# Patient Record
Sex: Female | Born: 1955 | Race: White | Hispanic: No | Marital: Married | Smoking: Never smoker
Health system: Southern US, Community
[De-identification: ages and names within clinical notes are randomized; demographics above are authoritative.]

## PROBLEM LIST (undated history)

## (undated) DIAGNOSIS — F32A Depression, unspecified: Secondary | ICD-10-CM

## (undated) DIAGNOSIS — H532 Diplopia: Secondary | ICD-10-CM

## (undated) DIAGNOSIS — F329 Major depressive disorder, single episode, unspecified: Secondary | ICD-10-CM

## (undated) DIAGNOSIS — M069 Rheumatoid arthritis, unspecified: Secondary | ICD-10-CM

## (undated) DIAGNOSIS — R5381 Other malaise: Secondary | ICD-10-CM

## (undated) DIAGNOSIS — N3010 Interstitial cystitis (chronic) without hematuria: Secondary | ICD-10-CM

## (undated) DIAGNOSIS — IMO0001 Reserved for inherently not codable concepts without codable children: Secondary | ICD-10-CM

## (undated) DIAGNOSIS — R413 Other amnesia: Secondary | ICD-10-CM

## (undated) DIAGNOSIS — I38 Endocarditis, valve unspecified: Secondary | ICD-10-CM

## (undated) DIAGNOSIS — R5383 Other fatigue: Secondary | ICD-10-CM

## (undated) DIAGNOSIS — M4712 Other spondylosis with myelopathy, cervical region: Secondary | ICD-10-CM

## (undated) DIAGNOSIS — F419 Anxiety disorder, unspecified: Secondary | ICD-10-CM

## (undated) DIAGNOSIS — F4310 Post-traumatic stress disorder, unspecified: Secondary | ICD-10-CM

## (undated) DIAGNOSIS — G43009 Migraine without aura, not intractable, without status migrainosus: Secondary | ICD-10-CM

## (undated) HISTORY — DX: Post-traumatic stress disorder, unspecified: F43.10

## (undated) HISTORY — DX: Endocarditis, valve unspecified: I38

## (undated) HISTORY — DX: Anxiety disorder, unspecified: F41.9

## (undated) HISTORY — DX: Reserved for inherently not codable concepts without codable children: IMO0001

## (undated) HISTORY — DX: Migraine without aura, not intractable, without status migrainosus: G43.009

## (undated) HISTORY — DX: Depression, unspecified: F32.A

## (undated) HISTORY — DX: Other spondylosis with myelopathy, cervical region: M47.12

## (undated) HISTORY — DX: Other malaise: R53.83

## (undated) HISTORY — DX: Interstitial cystitis (chronic) without hematuria: N30.10

## (undated) HISTORY — DX: Major depressive disorder, single episode, unspecified: F32.9

## (undated) HISTORY — DX: Other malaise: R53.81

## (undated) HISTORY — DX: Other amnesia: R41.3

## (undated) HISTORY — PX: ABDOMINAL HYSTERECTOMY: SHX81

## (undated) HISTORY — DX: Rheumatoid arthritis, unspecified: M06.9

## (undated) HISTORY — DX: Diplopia: H53.2

---

## 1991-11-24 HISTORY — PX: LUMBAR LAMINECTOMY: SHX95

## 2005-10-26 ENCOUNTER — Ambulatory Visit (HOSPITAL_COMMUNITY): Admission: RE | Admit: 2005-10-26 | Discharge: 2005-10-27 | Payer: Self-pay | Admitting: Neurosurgery

## 2006-07-05 ENCOUNTER — Encounter: Admission: RE | Admit: 2006-07-05 | Discharge: 2006-07-05 | Payer: Self-pay | Admitting: Neurosurgery

## 2006-08-02 ENCOUNTER — Ambulatory Visit (HOSPITAL_COMMUNITY): Admission: RE | Admit: 2006-08-02 | Discharge: 2006-08-02 | Payer: Self-pay | Admitting: Neurology

## 2009-09-24 ENCOUNTER — Encounter: Admission: RE | Admit: 2009-09-24 | Discharge: 2009-09-24 | Payer: Self-pay | Admitting: Neurology

## 2011-04-10 NOTE — Procedures (Signed)
BRAINSTEM AUDITORY EVOKED RESPONSE TEST   HISTORY:  This is a 55 year old patient who is complaining of weakness in  the right leg.  He is being evaluated for possible demyelinating disease.   Brainstem auditory evoked response test was performed bilaterally using 85  decibel rarefaction clicks in the ipsilateral ear.  The absolute latency for  waveforms I, III, and V are within normal limits bilaterally.  The interpeak  latencies for waveforms I/III, III/V and I/V are also within normal limits  bilaterally.  Amplitudes for waveforms I and V are within normal limits  bilaterally.   IMPRESSION:  Brainstem auditory evoked response test above is within normal  limits bilaterally.  No evidence of peripheral or central conduction delay  is seen on either side by today's evaluation.      Marlan Palau, M.D.  Electronically Signed     ZOX:WRUE  D:  08/02/2006 16:51:02  T:  08/03/2006 11:20:10  Job #:  [id number]

## 2011-04-10 NOTE — Op Note (Signed)
[name], [name]            ACCOUNT NO.:  [id number]   MEDICAL RECORD NO.:  [id number]          PATIENT TYPE:  AMB   LOCATION:  SDS                          FACILITY:  [facility]   PHYSICIAN:  Donalee Citrin, M.D.        DATE OF BIRTH:  Jun 30, 1956   DATE OF PROCEDURE:  10/26/2005  DATE OF DISCHARGE:                                 OPERATIVE REPORT   PREOPERATIVE DIAGNOSIS:  Lumbar spinal stenosis L3-L4 and L4-L5 with  bilateral lumbar radiculopathy with weakness in her legs.   POSTOPERATIVE DIAGNOSIS:  Lumbar spinal stenosis L3-L4 and L4-L5 with  bilateral lumbar radiculopathy with weakness in her legs.   PROCEDURE:  Decompressive lumbar laminectomy bilaterally at L3-L4 with  microdiscectomy from the right at L3-L4 and decompressive laminectomy on the  right at L4-L5 with microdiscectomy of the L4-L5 disc space.   SURGEON:  Donalee Citrin, M.D.   ASSISTANT:  Kathaleen Maser. Pool, M.D.   ANESTHESIA:  General endotracheal anesthesia.   HISTORY OF PRESENT ILLNESS:  The patient is a very pleasant 55 year old  female who has had long-standing back with leg pain but also weakness in her  leg that has gotten progressively worse whenever she would ambulate just  short distances.  This has been refractory to all forms of conservative  treatment.  Preoperative images showed severe lumbar spinal stenosis at L4-  L5 causing severe compression of the thecal sac and both L4 nerve roots as  well as L4-L5 severe facet arthropathy and lateral recess stenosis.  After  failure of all forms of conservative treatment and progressive worsening of  her symptoms, the patient is recommended decompressive laminectomies at L3-  L4 bilaterally and L4 on the right.  I discussed the risks and benefits of  the surgery which she understood and agreed to proceed forth.   DESCRIPTION OF PROCEDURE:  The patient was brought to the operating room,  given general anesthesia, positioned prone on the Wilson frame.  The back  was  prepped and draped in the usual sterile fashion.  After a preop x-ray  localized the L4 disc space, a midline incision was made.  Bovie  electrocautery was used to dissect through the subcutaneous tissue and  subperiosteal dissection was carried out at the lamina of what was believed  to be L3-L4.  The initial x-ray marked it to be the L2-L3 disc space and  attention was turned one space below this and the spinous process of L3 was  removed.  The facets were noted to be markedly hypertrophic and causing hour  glass compression of the thecal sac with severe facet arthropathy and  ligamentous hypertrophy.  This was all dissected off the dura with the 4  Penfield and the 3 and 4 mm Kerrison punch were used to underbite the facet  complex and lateral gutters to decompress the thecal sac.  There was noted  to be marked stenosis right at the superior aspect of the L4 lamina due to  the facet arthropathy.  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  Then, after the gutters  were underbitten, the 3 and 4 roots were decompressed and the interspace was  inspected and this was noted to be a free fragment and significant bulging  on the proximal under surface of the L4 root on the right so this was  incised with an 11 blade scalpel, pituitary rongeurs, and radically cleaned  out the interspace under microscopic illumination.  Then, attention was  turned to L4-L5.  Decompressive laminectomy was begun, again, severe facet  arthropathy was causing hour glass compression of the thecal sac at this  level and thi was all dissected free with a 4 Penfield and underbitten.  At  the end of the decompression, the 4 root was noted to be widely  decompressed.  The interspace was inspected and a disc was palpable  underneath the facet complex causing compression of both proximal fiber and  4  roots.  This was incised with an 11 blade scalpel and pituitary rongeurs  were used to adequately clean out the disc space in stepwise fashion.  The  wound was copiously irrigated and meticulous hemostasis was maintained.  Gelfoam was overlaid on top of the dura.  The muscle and fascia were  reapproximated with 0 interrupted Vicryl and the skin was closed with  running 4-0 subcuticular.  Benzoin and Steri-Strips were applied.  The  patient went to the recovery room in stable condition.  At the end of the  case, the needle, sponge, and instrument counts were correct.           ______________________________  Donalee Citrin, M.D.     GC/MEDQ  D:  10/26/2005  T:  10/26/2005  Job:  [id number]

## 2013-02-28 ENCOUNTER — Other Ambulatory Visit: Payer: Self-pay

## 2013-02-28 NOTE — Telephone Encounter (Signed)
Patient called clinic requesting appt with Dr Frances Furbish and refill on Lyrica.  Former [facility] patient.

## 2013-03-01 MED ORDER — PREGABALIN 50 MG PO CAPS: ORAL_CAPSULE | ORAL | Status: DC

## 2013-05-18 ENCOUNTER — Encounter: Payer: Self-pay | Admitting: Neurology

## 2013-05-18 ENCOUNTER — Ambulatory Visit (INDEPENDENT_AMBULATORY_CARE_PROVIDER_SITE_OTHER): Payer: BC Managed Care – PPO | Admitting: Neurology

## 2013-05-18 VITALS — BP 141/92 | HR 94 | Temp 97.2°F | Ht 65.5 in | Wt 188.0 lb

## 2013-05-18 DIAGNOSIS — M549 Dorsalgia, unspecified: Secondary | ICD-10-CM

## 2013-05-18 DIAGNOSIS — Z87898 Personal history of other specified conditions: Secondary | ICD-10-CM

## 2013-05-18 NOTE — Patient Instructions (Addendum)
I think overall you are doing fairly well but I do want to suggest a few things today:  Remember to drink plenty of fluid, eat healthy meals and do not skip any meals. Try to eat protein with a every meal and eat a healthy snack such as fruit or nuts in between meals. Try to keep a regular sleep-wake schedule and try to exercise daily, particularly in the form of walking, 20-30 minutes a day, if you can.   As far as your medications are concerned, I would like to suggest no changes, I will renew your Rx for your cream today.   As far as diagnostic testing: no new test needed.   I would like to see you back in 6 months, Alverda Skeans, our nurse will call you for an appt.
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
## 2013-06-19 ENCOUNTER — Other Ambulatory Visit: Payer: Self-pay

## 2013-06-19 MED ORDER — TOPIRAMATE 100 MG PO TABS: 100.0000 mg | ORAL_TABLET | Freq: Every day | ORAL | Status: DC

## 2013-06-19 MED ORDER — METHOCARBAMOL 500 MG PO TABS: 750.0000 mg | ORAL_TABLET | Freq: Three times a day (TID) | ORAL | Status: DC

## 2013-06-19 NOTE — Telephone Encounter (Signed)
Former [facility] patient, was assigned to Dr Frances Furbish, but has had a transfer of care to Dr Anne Hahn.

## 2013-08-25 ENCOUNTER — Other Ambulatory Visit: Payer: Self-pay

## 2013-08-25 MED ORDER — PREGABALIN 50 MG PO CAPS: ORAL_CAPSULE | ORAL | Status: DC

## 2013-08-25 NOTE — Telephone Encounter (Signed)
Rx signed and faxed.

## 2013-09-28 ENCOUNTER — Other Ambulatory Visit: Payer: Self-pay

## 2013-12-11 ENCOUNTER — Other Ambulatory Visit: Payer: Self-pay | Admitting: Neurology

## 2013-12-27 ENCOUNTER — Ambulatory Visit (INDEPENDENT_AMBULATORY_CARE_PROVIDER_SITE_OTHER): Payer: BC Managed Care – PPO | Admitting: Neurology

## 2013-12-27 ENCOUNTER — Encounter: Payer: Self-pay | Admitting: Neurology

## 2013-12-27 VITALS — BP 148/86 | HR 83 | Wt 184.0 lb

## 2013-12-27 DIAGNOSIS — IMO0001 Reserved for inherently not codable concepts without codable children: Secondary | ICD-10-CM

## 2013-12-27 DIAGNOSIS — R413 Other amnesia: Secondary | ICD-10-CM

## 2013-12-27 HISTORY — DX: Other amnesia: R41.3

## 2013-12-27 MED ORDER — ACETAMINOPHEN-CODEINE #3 300-30 MG PO TABS: 1.0000 | ORAL_TABLET | Freq: Four times a day (QID) | ORAL | Status: DC | PRN

## 2013-12-27 MED ORDER — METHOCARBAMOL 750 MG PO TABS: 750.0000 mg | ORAL_TABLET | Freq: Three times a day (TID) | ORAL | Status: DC

## 2013-12-27 NOTE — Progress Notes (Signed)
Reason for visit: Fibromyalgia  [name] is an 58 y.o. female  History of present illness:  [name] is a 58 year old right-handed white female with a history of fibromyalgia. The patient also reports anxiety, depression, and a posttraumatic stress disorder. The patient has had some problems with cognitive processing that she has had for several years. The patient has been followed through this office since 2007, seen previously by Dr. Sandria Manly. The patient reports ongoing problems with fatigue, and she has diffuse pain in the back, neck, arms, and legs. The patient denies any true weakness. The patient indicates that she sleeps well at night. The patient claims that her memory problems and concentration problems began in August of 2011 after a nervous breakdown. The patient has been seen and followed actively through a psychiatrist. The patient is on some Lyrica, which also indicates that a topical cream that was given to her by Dr. Sandria Manly has been helpful for the pain. The patient has run out of this cream. The patient indicates that regular massage therapy also helps her, but the insurance company does not pay for this therapy. The patient is sent to this office for an evaluation.  Past Medical History  Diagnosis Date  . Myalgia and myositis, unspecified   . Chronic interstitial cystitis   . Memory loss   . Cervical spondylosis with myelopathy   . Other malaise and fatigue   . Rheumatoid arthritis(714.0)   . Migraine without aura, without mention of intractable migraine without mention of status migrainosus   . Myalgia and myositis, unspecified   . Diplopia   . Memory disorder   . PTSD (post-traumatic stress disorder)   . Anxiety and depression   . Memory deficit 12/27/2013    Past Surgical History  Procedure Laterality Date  . Lumbar laminectomy Left 1993  . Abdominal hysterectomy    . Cesarean section      Family History  Problem Relation Age of Onset  . Heart failure  Father   . Hypertension Mother     Social history:  reports that she has never smoked. She does not have any smokeless tobacco history on file. She reports that she does not drink alcohol or use illicit drugs.    Allergies  Allergen Reactions  . Asa [Aspirin] Shortness Of Breath    Medications:  Current Outpatient Prescriptions on File Prior to Visit  Medication Sig Dispense Refill  . b complex vitamins capsule Take 1 capsule by mouth daily.      . busPIRone (BUSPAR) 30 MG tablet Take 30 mg by mouth 2 (two) times daily.       . cholecalciferol (VITAMIN D) 1000 UNITS tablet Take 1,000 Units by mouth daily.      . DULoxetine (CYMBALTA) 30 MG capsule Take 30 mg by mouth daily.       Marland Kitchen eletriptan (RELPAX) 40 MG tablet Take 40 mg by mouth as needed for migraine. One tablet by mouth at onset of headache. May repeat in 2 hours if headache persists or recurs.      Marland Kitchen estradiol (ESTRACE) 0.1 MG/GM vaginal cream Place 2 g vaginally. M, W, F      . fenofibrate (TRICOR) 48 MG tablet Take 48 mg by mouth daily.       . folic acid (FOLVITE) 1 MG tablet Take 1 mg by mouth daily.       Marland Kitchen GLUCOSAMINE CHONDROITIN COMPLX PO Take by mouth.      . hydrochlorothiazide (HYDRODIURIL)  25 MG tablet Take 25 mg by mouth daily.       Marland Kitchen HYDROcodone-acetaminophen (NORCO/VICODIN) 5-325 MG per tablet Take 1 tablet by mouth every 6 (six) hours as needed for pain.      . hyoscyamine (ANASPAZ) 0.125 MG TBDP Place 0.125 mg under the tongue every 4 (four) hours as needed.      Marland Kitchen LORazepam (ATIVAN) 0.5 MG tablet Take 0.5 mg by mouth 2 (two) times daily.       . Magnesium 100 MG TABS Take 100 mg by mouth. 2 tabs in a.m; 2 tabs in pm      . Methotrexate Sodium, PF, 50 MG/2ML SOLN Inject 25 mg as directed once a week.       . Multiple Vitamin (MULTIVITAMIN) capsule Take 1 capsule by mouth daily.      Marland Kitchen NASONEX 50 MCG/ACT nasal spray Place 2 sprays into the nose daily.       . Omega-3 Fatty Acids (FISH OIL) 1000 MG CAPS Take  1,000 mg by mouth daily.      Marland Kitchen omeprazole (PRILOSEC) 40 MG capsule Take 40 mg by mouth 2 (two) times daily.       . pregabalin (LYRICA) 50 MG capsule Two capsules po in am, one cap at noon and two caps at bedtime  450 capsule  1  . Probiotic Product (PROBIOTIC DAILY) CAPS Take by mouth.      . sertraline (ZOLOFT) 100 MG tablet Take 200 mg by mouth daily.       . Tocilizumab (ACTEMRA IV) Inject into the vein.      Marland Kitchen topiramate (TOPAMAX) 100 MG tablet TAKE 1 BY MOUTH DAILY  90 tablet  0   No current facility-administered medications on file prior to visit.    ROS:  Out of a complete 14 system review of symptoms, the patient complains only of the following symptoms, and all other reviewed systems are negative.  Fatigue Neck pain, neck stiffness, ear pain, reading in the ears Light sensitivity, eye pain Excessive thirst Environmental allergies, frequent infections Sleep talking, acting out dreams Joint pain, joint swelling, back pain, achy muscles, muscle cramps, walking difficulties, coordination problems Memory loss, headache, speech difficulties Anxiety and depression  Blood pressure 148/86, pulse 83, weight 184 lb (83.462 kg).  Physical Exam  General: The patient is alert and cooperative at the time of the examination. The patient has moderate obesity.  Neuromuscular: The patient has excellent range of motion of the cervical and lumbosacral spine.  Skin: No significant peripheral edema is noted.   Neurologic Exam  Mental status: The patient is oriented x 3. Mini-Mental status examination done today shows a total score of 29/30.  Cranial nerves: Facial symmetry is present. Speech is normal, no aphasia or dysarthria is noted. Extraocular movements are full. Visual fields are full.  Motor: The patient has good strength in all 4 extremities.  Sensory examination: Soft touch sensation is decreased on the right leg as compared to left, symmetric in arms and on the  face.  Coordination: The patient has good finger-nose-finger and heel-to-shin bilaterally.  Gait and station: The patient has a normal gait. Tandem gait is normal. Romberg is negative. No drift is seen.  Reflexes: Deep tendon reflexes are symmetric.   Assessment/Plan:  1. Fibromyalgia  2. Anxiety and depression  3. Posttraumatic stress disorder  4. Reported memory disturbance  The patient may have some difficulty with concentration associated with the fibromyalgia. We will need to follow the memory issues  over time. The patient will be given a prescription for a compounded cream for the diffuse neuromuscular pain. The patient was given a prescription for Tylenol #3, and for Robaxin. The patient will followup through this office in 6 months.  Marlan Palau MD 12/27/2013 8:21 PM  [facility] [address] [place], [place] [address]  Phone [phone] Fax [phone]

## 2013-12-27 NOTE — Patient Instructions (Signed)

## 2014-02-09 ENCOUNTER — Other Ambulatory Visit: Payer: Self-pay

## 2014-02-09 MED ORDER — PREGABALIN 50 MG PO CAPS: ORAL_CAPSULE | ORAL | Status: DC

## 2014-02-09 NOTE — Telephone Encounter (Signed)
Rx signed and faxed.

## 2014-03-13 ENCOUNTER — Telehealth: Payer: Self-pay | Admitting: Nurse Practitioner

## 2014-03-13 NOTE — Telephone Encounter (Signed)
Pharmacist with information for you Shanda Bumps, CPHT. FYI

## 2014-03-13 NOTE — Telephone Encounter (Signed)
Pharmacist calling to give [place] a heads up that [employer] will be faxing [employer] over another form to fill out for the recently prescribed compound cream.

## 2014-03-13 NOTE — Telephone Encounter (Signed)
Forms have been completed and faxed back to [facility]

## 2014-04-02 ENCOUNTER — Other Ambulatory Visit: Payer: Self-pay | Admitting: Neurology

## 2014-06-27 ENCOUNTER — Telehealth: Payer: Self-pay | Admitting: *Deleted

## 2014-06-27 NOTE — Telephone Encounter (Signed)
Patient returning my call appointment was r/s to 2 pm on 06/28/14 with NP MM.

## 2014-06-27 NOTE — Telephone Encounter (Signed)
Calling patient to see if she could come in at an earlier time, no answer, left voice message to return the call.

## 2014-06-28 ENCOUNTER — Encounter: Payer: Self-pay | Admitting: Adult Health

## 2014-06-28 ENCOUNTER — Ambulatory Visit (INDEPENDENT_AMBULATORY_CARE_PROVIDER_SITE_OTHER): Payer: BC Managed Care – PPO | Admitting: Adult Health

## 2014-06-28 VITALS — BP 131/83 | HR 72 | Wt 191.0 lb

## 2014-06-28 DIAGNOSIS — R413 Other amnesia: Secondary | ICD-10-CM

## 2014-06-28 DIAGNOSIS — IMO0001 Reserved for inherently not codable concepts without codable children: Secondary | ICD-10-CM

## 2014-06-28 NOTE — Patient Instructions (Signed)
Fibromyalgia Fibromyalgia is a disorder that is often misunderstood. It is associated with muscular pains and tenderness that comes and goes. It is often associated with fatigue and sleep disturbances. Though it tends to be long-lasting, fibromyalgia is not life-threatening. CAUSES  The exact cause of fibromyalgia is unknown. People with certain gene types are predisposed to developing fibromyalgia and other conditions. Certain factors can play a role as triggers, such as:  Spine disorders.  Arthritis.  Severe injury (trauma) and other physical stressors.  Emotional stressors. SYMPTOMS   The main symptom is pain and stiffness in the muscles and joints, which can vary over time.  Sleep and fatigue problems. Other related symptoms may include:  Bowel and bladder problems.  Headaches.  Visual problems.  Problems with odors and noises.  Depression or mood changes.  Painful periods (dysmenorrhea).  Dryness of the skin or eyes. DIAGNOSIS  There are no specific tests for diagnosing fibromyalgia. Patients can be diagnosed accurately from the specific symptoms they have. The diagnosis is made by determining that nothing else is causing the problems. TREATMENT  There is no cure. Management includes medicines and an active, healthy lifestyle. The goal is to enhance physical fitness, decrease pain, and improve sleep. HOME CARE INSTRUCTIONS   Only take over-the-counter or prescription medicines as directed by your caregiver. Sleeping pills, tranquilizers, and pain medicines may make your problems worse.  Low-impact aerobic exercise is very important and advised for treatment. At first, it may seem to make pain worse. Gradually increasing your tolerance will overcome this feeling.  Learning relaxation techniques and how to control stress will help you. Biofeedback, visual imagery, hypnosis, muscle relaxation, yoga, and meditation are all options.  Anti-inflammatory medicines and  physical therapy may provide short-term help.  Acupuncture or massage treatments may help.  Take muscle relaxant medicines as suggested by your caregiver.  Avoid stressful situations.  Plan a healthy lifestyle. This includes your diet, sleep, rest, exercise, and friends.  Find and practice a hobby you enjoy.  Join a fibromyalgia support group for interaction, ideas, and sharing advice. This may be helpful. SEEK MEDICAL CARE IF:  You are not having good results or improvement from your treatment. FOR MORE INFORMATION  National Fibromyalgia Association: www.fmaware.org Arthritis Foundation: www.arthritis.org Document Released: 11/09/2005 Document Revised: 02/01/2012 Document Reviewed: 02/19/2010 Exit[employer] Patient Information 2015 [employer]. This information is not intended to replace advice given to you by your health care provider. Make sure you discuss any questions you have with your health care provider.  

## 2014-06-28 NOTE — Progress Notes (Addendum)
PATIENT: [name] DOB: August 01, 1956  REASON FOR VISIT: follow up HISTORY FROM: patient  HISTORY OF PRESENT ILLNESS: [name] is a 58 year old white female with a history of fibromyalgia. She returns today for follow-up. She is currently taking lyrica and is tolerating it well. She also uses a compound cream that works well for her. At the last visit she was prescribed Robaxin and Tylenol #3. Patient has a history of anxiety, depression and PTSD. She is followed by a psychiatrist. Patient states things are going better than it has been. She is getting three massages a week and that has been very helpful. Patient has back and neck pain that is relieved by the massages. Patient states that overall she is pain free right now with the combination of medications and massages. Patient continues to have issues with processing information. Patient states that she has different routines for when she gets overwhelmed with information. Patient states that she is now sleeping very well. Overall patient feels like she doing well and that pain is well controlled. No new medical issues since last seen.    HISTORY 12/27/13 (CW):[name] is a 58 year old right-handed white female with a history of fibromyalgia. The patient also reports anxiety, depression, and a posttraumatic stress disorder. The patient has had some problems with cognitive processing that she has had for several years. The patient has been followed through this office since 2007, seen previously by Dr. Sandria Manly. The patient reports ongoing problems with fatigue, and she has diffuse pain in the back, neck, arms, and legs. The patient denies any true weakness. The patient indicates that she sleeps well at night. The patient claims that her memory problems and concentration problems began in August of 2011 after a nervous breakdown. The patient has been seen and followed actively through a psychiatrist. The patient is on some Lyrica, which also  indicates that a topical cream that was given to her by Dr. Sandria Manly has been helpful for the pain. The patient has run out of this cream. The patient indicates that regular massage therapy also helps her, but the insurance company does not pay for this therapy. The patient is sent to this office for an evaluation.  REVIEW OF SYSTEMS: Full 14 system review of systems performed and notable only for:  Constitutional: N/A  Eyes: N/A Ear/Nose/Throat: Facial swelling at cheekbones Skin: N/A  Cardiovascular: N/A  Respiratory: N/A  Gastrointestinal: Constipation Genitourinary: N/A Hematology/Lymphatic: N/A  Endocrine: N/A Musculoskeletal: Joint pain, joint swelling, back pain, E. muscles, muscle cramps, neck stiffness, neck pain Allergy/Immunology: N/A  Neurological: Memory loss, headache Psychiatric: Depression, nervous/anxious Sleep: Sleep talking   ALLERGIES: Allergies  Allergen Reactions  . Asa [Aspirin] Shortness Of Breath  . Ibuprofen Other (See Comments)    Wheezing and throat swells    HOME MEDICATIONS: Outpatient Prescriptions Prior to Visit  Medication Sig Dispense Refill  . acetaminophen-codeine (TYLENOL #3) 300-30 MG per tablet Take 1 tablet by mouth every 6 (six) hours as needed for moderate pain.  180 tablet  0  . b complex vitamins capsule Take 1 capsule by mouth daily.      . busPIRone (BUSPAR) 30 MG tablet Take 30 mg by mouth 2 (two) times daily.       . cholecalciferol (VITAMIN D) 1000 UNITS tablet Take 1,000 Units by mouth daily.      . DULoxetine (CYMBALTA) 30 MG capsule Take 30 mg by mouth daily.       Marland Kitchen eletriptan (RELPAX) 40 MG  tablet Take 40 mg by mouth as needed for migraine. One tablet by mouth at onset of headache. May repeat in 2 hours if headache persists or recurs.      Marland Kitchen. estradiol (ESTRACE) 0.1 MG/GM vaginal cream Place 2 g vaginally. M, W, F      . fenofibrate (TRICOR) 48 MG tablet Take 48 mg by mouth daily.       . Ferrous Fumarate (IRON) 18 MG TBCR Take  18 mg by mouth daily.      . fluconazole (DIFLUCAN) 100 MG tablet Take 100 mg by mouth daily.      . folic acid (FOLVITE) 1 MG tablet Take 1 mg by mouth daily.       Marland Kitchen. GLUCOSAMINE CHONDROITIN COMPLX PO Take by mouth.      . hydrochlorothiazide (HYDRODIURIL) 25 MG tablet Take 25 mg by mouth daily.       Marland Kitchen. HYDROcodone-acetaminophen (NORCO/VICODIN) 5-325 MG per tablet Take 1 tablet by mouth every 6 (six) hours as needed for pain.      . hyoscyamine (ANASPAZ) 0.125 MG TBDP Place 0.125 mg under the tongue every 4 (four) hours as needed.      Marland Kitchen. LORazepam (ATIVAN) 0.5 MG tablet Take 0.5 mg by mouth 2 (two) times daily.       . Magnesium 100 MG TABS Take 100 mg by mouth. 2 tabs in a.m; 2 tabs in pm      . methocarbamol (ROBAXIN) 750 MG tablet Take 1 tablet (750 mg total) by mouth 3 (three) times daily.  270 tablet  1  . Methotrexate Sodium, PF, 50 MG/2ML SOLN Inject 25 mg as directed once a week.       . Misc Natural Products (GLUCOSAMINE CHOND COMPLEX/MSM) TABS Take 1 tablet by mouth daily as needed.      . Multiple Vitamin (MULTIVITAMIN) capsule Take 1 capsule by mouth daily.      Marland Kitchen. NASONEX 50 MCG/ACT nasal spray Place 2 sprays into the nose daily.       . Omega-3 Fatty Acids (FISH OIL) 1000 MG CAPS Take 1,000 mg by mouth daily.      Marland Kitchen. omeprazole (PRILOSEC) 40 MG capsule Take 40 mg by mouth 2 (two) times daily.       . pregabalin (LYRICA) 50 MG capsule Two capsules po in am, one cap at noon and two caps at bedtime  450 capsule  1  . Probiotic Product (PROBIOTIC DAILY) CAPS Take by mouth.      . sertraline (ZOLOFT) 100 MG tablet Take 200 mg by mouth daily.       . Tocilizumab (ACTEMRA IV) Inject into the vein.      Marland Kitchen. topiramate (TOPAMAX) 100 MG tablet TAKE 1 BY MOUTH DAILY  90 tablet  1   No facility-administered medications prior to visit.    PAST MEDICAL HISTORY: Past Medical History  Diagnosis Date  . Myalgia and myositis, unspecified   . Chronic interstitial cystitis   . Memory loss   .  Cervical spondylosis with myelopathy   . Other malaise and fatigue   . Rheumatoid arthritis(714.0)   . Migraine without aura, without mention of intractable migraine without mention of status migrainosus   . Myalgia and myositis, unspecified   . Diplopia   . Memory disorder   . PTSD (post-traumatic stress disorder)   . Anxiety and depression   . Memory deficit 12/27/2013    PAST SURGICAL HISTORY: Past Surgical History  Procedure Laterality Date  .  Lumbar laminectomy Left 1993  . Abdominal hysterectomy    . Cesarean section      FAMILY HISTORY: Family History  Problem Relation Age of Onset  . Heart failure Father   . Hypertension Mother     SOCIAL HISTORY: History   Social History  . Marital Status: Married    Spouse Name: N/A    Number of Children: N/A  . Years of Education: N/A   Occupational History  . Not on file.   Social History Main Topics  . Smoking status: Never Smoker   . Smokeless tobacco: Not on file  . Alcohol Use: No  . Drug Use: No  . Sexual Activity: Not on file   Other Topics Concern  . Not on file   Social History Narrative  . No narrative on file      PHYSICAL EXAM  There were no vitals filed for this visit. There is no weight on file to calculate BMI.  Generalized: Well developed, in no acute distress   Neurological examination  Mentation: Alert oriented to time, place, history taking. Follows all commands speech and language fluent. MMSE 29/30 Cranial nerve II-XII: Pupils were equal round reactive to light. Extraocular movements were full, visual field were full on confrontational test. Facial sensation and strength were normal. Uvula tongue midline. Head turning and shoulder shrug  were normal and symmetric. Motor: The motor testing reveals 5 over 5 strength of all 4 extremities. Good symmetric motor tone is noted throughout.  Sensory: Sensory testing is intact to soft touch on all 4 extremities. No evidence of extinction is noted.    Coordination: Cerebellar testing reveals good finger-nose-finger and heel-to-shin bilaterally.  Gait and station: Gait is normal. Tandem gait is normal. Romberg is negative. No drift is seen.  Reflexes: Deep tendon reflexes are symmetric and normal bilaterally.    DIAGNOSTIC DATA (LABS, IMAGING, TESTING) - I reviewed patient records, labs, notes, testing and imaging myself where available.  ASSESSMENT AND PLAN 58 y.o. year old female  has a past medical history of Myalgia and myositis, unspecified; Chronic interstitial cystitis; Memory loss; Cervical spondylosis with myelopathy; Other malaise and fatigue; Rheumatoid arthritis(714.0); Migraine without aura, without mention of intractable migraine without mention of status migrainosus; Myalgia and myositis, unspecified; Diplopia; Memory disorder; PTSD (post-traumatic stress disorder); Anxiety and depression; and Memory deficit (12/27/2013). here with:  1. Fibromyalgia  2. Memory deficit 3. Anxiety and depression    Overall patient is doing very well. She feels that this is the best that she has felt since her symptoms began. She continues to take Lyrica and is tolerating it well. She continues to use the compound cream as needed. She also uses Robaxin and Tylenol # 3. She does not need any refills today. Patient currently gets 3 massages a week and feels that this has been very beneficial for her neck and back pain. I am pleased that the patient has achieved relief from her pain. Patient should follow up in 6 months or sooner if needed.  Butch Penny, MSN, NP-C 06/28/2014, 1:52 PM [facility] [address] [place], [place] [address] [phone]  Note: This document was prepared with digital dictation and possible smart phrase technology. Any transcriptional errors that result from this process are unintentional.

## 2014-08-09 ENCOUNTER — Other Ambulatory Visit: Payer: Self-pay

## 2014-08-09 MED ORDER — PREGABALIN 50 MG PO CAPS: ORAL_CAPSULE | ORAL | Status: DC

## 2014-09-17 ENCOUNTER — Other Ambulatory Visit: Payer: Self-pay | Admitting: Neurology

## 2014-12-31 ENCOUNTER — Ambulatory Visit (INDEPENDENT_AMBULATORY_CARE_PROVIDER_SITE_OTHER): Payer: BLUE CROSS/BLUE SHIELD | Admitting: Adult Health

## 2014-12-31 ENCOUNTER — Encounter: Payer: Self-pay | Admitting: Adult Health

## 2014-12-31 VITALS — BP 137/83 | HR 90

## 2014-12-31 DIAGNOSIS — M791 Myalgia: Secondary | ICD-10-CM

## 2014-12-31 DIAGNOSIS — IMO0001 Reserved for inherently not codable concepts without codable children: Secondary | ICD-10-CM

## 2014-12-31 DIAGNOSIS — G43019 Migraine without aura, intractable, without status migrainosus: Secondary | ICD-10-CM

## 2014-12-31 DIAGNOSIS — R413 Other amnesia: Secondary | ICD-10-CM

## 2014-12-31 DIAGNOSIS — M609 Myositis, unspecified: Secondary | ICD-10-CM

## 2014-12-31 MED ORDER — TOPIRAMATE 50 MG PO TABS: ORAL_TABLET | ORAL | Status: DC

## 2014-12-31 MED ORDER — ELETRIPTAN HYDROBROMIDE 40 MG PO TABS: 40.0000 mg | ORAL_TABLET | ORAL | Status: DC | PRN

## 2014-12-31 NOTE — Patient Instructions (Signed)
I will increase Topamax to 1 tablet 50 mg in the morning and 2 tablets 100 mg in the evening to help with nerve related pain as well as the migraines. Relpax refilled.  Continue Cymbalta, lyrica, compound cream and tylenol #3 for associated discomfort. If your symptoms worsen or do not improve please let us know.

## 2014-12-31 NOTE — Progress Notes (Signed)
I have read the note, and I agree with the clinical assessment and plan.  WILLIS,CHARLES KEITH   

## 2014-12-31 NOTE — Progress Notes (Signed)
PATIENT: [name] DOB: [birth year]  REASON FOR VISIT: follow up- fibromyalgia,memory, migraines HISTORY FROM: patient  HISTORY OF PRESENT ILLNESS: [name] is a 59 year old white female with a history of fibromyalgia. She returns today for follow-up. She is currently taking lyrica and is tolerating it well. She also uses a compound cream that works well for her. She also takes Robaxin and Tylenol #3. She reports that she was doing well until 3 months ago she began to have right occiptal nerve pain. She states that the pain will radiate from the right occpital nerve to the shoulder. She continues to see a massage therapist and they have been working in the occiptal area to help relieve the pain. Patient also has a history of migraines. She uses topamax and Relpax. She reports that she has approximately 2-4 migraines a month but she states this can vary. Her headaches are normally located in thetemporal area bilaterally  and sometimes in the occpital area. + nausea denies vomiting. + photophobia and phonophobia. No new medical issues since last seen.   HISTORY 06/28/14: [name] is a 59 year old white female with a history of fibromyalgia. She returns today for follow-up. She is currently taking lyrica and is tolerating it well. She also uses a compound cream that works well for her. At the last visit she was prescribed Robaxin and Tylenol #3. Patient has a history of anxiety, depression and PTSD. She is followed by a psychiatrist. Patient states things are going better than it has been. She is getting three massages a week and that has been very helpful. Patient has back and neck pain that is relieved by the massages. Patient states that overall she is pain free right now with the combination of medications and massages. Patient continues to have issues with processing information. Patient states that she has different routines for when she gets overwhelmed with information. Patient states  that she is now sleeping very well. Overall patient feels like she doing well and that pain is well controlled. No new medical issues since last seen.    REVIEW OF SYSTEMS: Out of a complete 14 system review of symptoms, the patient complains only of the following symptoms, and all other reviewed systems are negative.  Eye discharge, blurred vision, ear pain, abdominal pain, constipation, nausea, excessive thirst, acting out dreams, urgency, delayed urination, chest wall pain, rosacea, memory loss, headache, decreased concentration, depression, nervous/anxious, hallucinations  ALLERGIES: Allergies  Allergen Reactions  . Asa [Aspirin] Shortness Of Breath  . Ibuprofen Other (See Comments)    Wheezing and throat swells    HOME MEDICATIONS: Outpatient Prescriptions Prior to Visit  Medication Sig Dispense Refill  . acetaminophen-codeine (TYLENOL #3) 300-30 MG per tablet Take 1 tablet by mouth every 6 (six) hours as needed for moderate pain. 180 tablet 0  . azelastine (OPTIVAR) 0.05 % ophthalmic solution Place 0.05 drops into both eyes daily.    Marland Kitchen b complex vitamins capsule Take 1 capsule by mouth daily.    . busPIRone (BUSPAR) 30 MG tablet Take 30 mg by mouth 2 (two) times daily.     . cholecalciferol (VITAMIN D) 1000 UNITS tablet Take 1,000 Units by mouth daily.    . DULoxetine (CYMBALTA) 30 MG capsule Take 30 mg by mouth daily.     Marland Kitchen eletriptan (RELPAX) 40 MG tablet Take 40 mg by mouth as needed for migraine. One tablet by mouth at onset of headache. May repeat in 2 hours if headache persists or recurs.    Marland Kitchen  estradiol (ESTRACE) 0.1 MG/GM vaginal cream Place 2 g vaginally. M, W, F    . fenofibrate (TRICOR) 48 MG tablet Take 48 mg by mouth daily.     . Ferrous Fumarate (IRON) 18 MG TBCR Take 18 mg by mouth daily.    . fluconazole (DIFLUCAN) 100 MG tablet Take 100 mg by mouth daily.    . folic acid (FOLVITE) 1 MG tablet Take 1 mg by mouth daily.     . Glucosamine-Chondroitin-MSM 500-250-250 MG  CAPS Take 1 tablet by mouth daily.    . hydrochlorothiazide (HYDRODIURIL) 25 MG tablet Take 25 mg by mouth daily.     Marland Kitchen HYDROcodone-acetaminophen (NORCO/VICODIN) 5-325 MG per tablet Take 1 tablet by mouth every 6 (six) hours as needed for pain.    . hydrocortisone valerate cream (WESTCORT) 0.2 % Apply 0.2 g topically daily.    . hyoscyamine (ANASPAZ) 0.125 MG TBDP Place 0.125 mg under the tongue every 4 (four) hours as needed.    Marland Kitchen LORazepam (ATIVAN) 0.5 MG tablet Take 0.5 mg by mouth 2 (two) times daily.     . Magnesium 100 MG TABS Take 100 mg by mouth. 2 tabs in a.m; 2 tabs in pm    . methocarbamol (ROBAXIN) 750 MG tablet TAKE 1 BY MOUTH 3 TIMES DAILY 270 tablet 1  . Methotrexate Sodium, PF, 50 MG/2ML SOLN Inject 25 mg as directed once a week.     . Misc Natural Products (GLUCOSAMINE CHOND COMPLEX/MSM) TABS Take 1 tablet by mouth daily as needed.    . Multiple Vitamin (MULTIVITAMIN) capsule Take 1 capsule by mouth daily.    . Multiple Vitamins-Minerals (MULTIVITAMIN PO) Take 1 tablet by mouth daily.    Marland Kitchen NASONEX 50 MCG/ACT nasal spray Place 2 sprays into the nose daily.     . Omega-3 Fatty Acids (FISH OIL) 1000 MG CAPS Take 1,000 mg by mouth daily.    Marland Kitchen omeprazole (PRILOSEC) 40 MG capsule Take 40 mg by mouth 2 (two) times daily.     . pregabalin (LYRICA) 50 MG capsule Two capsules po in am, one cap at noon and two caps at bedtime 450 capsule 1  . Probiotic Product (PROBIOTIC DAILY) CAPS Take by mouth.    . rosuvastatin (CRESTOR) 10 MG tablet Take 10 mg by mouth daily.    . sertraline (ZOLOFT) 100 MG tablet Take 200 mg by mouth daily.     Marland Kitchen topiramate (TOPAMAX) 100 MG tablet TAKE 1 BY MOUTH DAILY 90 tablet 1   No facility-administered medications prior to visit.    PAST MEDICAL HISTORY: Past Medical History  Diagnosis Date  . Myalgia and myositis, unspecified   . Chronic interstitial cystitis   . Memory loss   . Cervical spondylosis with myelopathy   . Other malaise and fatigue   .  Rheumatoid arthritis(714.0)   . Migraine without aura, without mention of intractable migraine without mention of status migrainosus   . Myalgia and myositis, unspecified   . Diplopia   . Memory disorder   . PTSD (post-traumatic stress disorder)   . Anxiety and depression   . Memory deficit 12/27/2013    PAST SURGICAL HISTORY: Past Surgical History  Procedure Laterality Date  . Lumbar laminectomy Left 1993  . Abdominal hysterectomy    . Cesarean section      FAMILY HISTORY: Family History  Problem Relation Age of Onset  . Heart failure Father   . Hypertension Mother     PHYSICAL EXAM  Filed Vitals:  12/31/14 1336  BP: 137/83  Pulse: 90   There is no weight on file to calculate BMI.  Generalized: Well developed, in no acute distress   Neurological examination  Mentation: Alert oriented to time, place, history taking. Follows all commands speech and language fluent. MMSE 30/30 Cranial nerve II-XII: Pupils were equal round reactive to light. Extraocular movements were full, visual field were full on confrontational test. Facial sensation and strength were normal. Uvula tongue midline. Head turning and shoulder shrug  were normal and symmetric. Motor: The motor testing reveals 5 over 5 strength of all 4 extremities. Good symmetric motor tone is noted throughout.  Sensory: Sensory testing is intact to soft touch on all 4 extremities. No evidence of extinction is noted.  Coordination: Cerebellar testing reveals good finger-nose-finger and heel-to-shin bilaterally.  Gait and station: Gait is normal. Tandem gait is normal. Romberg is negative. No drift is seen.  Reflexes: Deep tendon reflexes are symmetric and normal bilaterally.   DIAGNOSTIC DATA (LABS, IMAGING, TESTING) - I reviewed patient records, labs, notes, testing and imaging myself where available.     ASSESSMENT AND PLAN 59 y.o. year old female  has a past medical history of Myalgia and myositis, unspecified;  Chronic interstitial cystitis; Memory loss; Cervical spondylosis with myelopathy; Other malaise and fatigue; Rheumatoid arthritis(714.0); Migraine without aura, without mention of intractable migraine without mention of status migrainosus; Myalgia and myositis, unspecified; Diplopia; Memory disorder; PTSD (post-traumatic stress disorder); Anxiety and depression; and Memory deficit (12/27/2013). here with;  1. Fibromyalgia 2. Memory disturbance 3. Migraine headaches  Patient states that she is having some nerve pain  the occipital region that radiates to the right shoulder. She has been having increased migraines lately- she is currently taking Topamax 100 mg at bedtime and Relpax. I will increase Topamax to 50 mg in the AM and 100 mg in the PM. This may also be beneficial for her nerve related pain in the occipital region. I have advised the patient that if she continues to have the occipital nerve pain despite the increase in Topamax, we may increase Lyrica. She verbalizes understanding. She will F/U in 6 months or sooner if needed.   Butch Penny, MSN, NP-C 12/31/2014, 1:38 PM [facility] [address], [address] [place], [place] [address] [phone]  Note: This document was prepared with digital dictation and possible smart phrase technology. Any transcriptional errors that result from this process are unintentional.

## 2015-01-22 ENCOUNTER — Telehealth: Payer: Self-pay | Admitting: Adult Health

## 2015-01-22 NOTE — Telephone Encounter (Signed)
Patient is calling to get prior authorization for Relpax 40 mg and should be sent to [facility].  Please call.

## 2015-01-23 NOTE — Telephone Encounter (Signed)
This message was just forwarded to me today.  Ins has been contacted and provided with all clinical info.  Request is currently pending.  I called the patient back.  She is aware.

## 2015-02-05 ENCOUNTER — Telehealth: Payer: Self-pay | Admitting: Adult Health

## 2015-02-05 NOTE — Telephone Encounter (Signed)
I called ins to follow up.  Spoke with Ronaldo Miyamoto.  Says they have denied the request for coverage and will fax [place] this info.

## 2015-02-05 NOTE — Telephone Encounter (Signed)
Pt is calling stating her Rx for eletriptan (RELPAX) 40 MG tablet, her insurance will not pay for it.  She states [employer] gave 3 options to choose from, she states they faxed it over.  Please call and advise.

## 2015-02-06 MED ORDER — RIZATRIPTAN BENZOATE 10 MG PO TBDP: 10.0000 mg | ORAL_TABLET | ORAL | Status: DC | PRN

## 2015-02-06 NOTE — Telephone Encounter (Signed)
I called the patient. She has not tried Maxalt. I will order maxalt for her to try with her acute headaches.

## 2015-02-06 NOTE — Telephone Encounter (Signed)
Received fax from ins.  Her current plan will not allow coverage of Relpax without a documented trail and failure of Naratriptan, Rizatriptan and Sumatriptan.  Would you like to change to a formualry alternative?  Please advise.  Thank you.

## 2015-02-06 NOTE — Telephone Encounter (Signed)
Unfortunately, her plan requires trial and failure of Naratriptan, Rizatriptan and Sumatriptan.  I spoke with the patient who says she has only tried Sumatriptan and Relpax to her knowledge.

## 2015-02-06 NOTE — Telephone Encounter (Signed)
Patient states that she tried Imitrex when she was seeing Dr. Ala Dach- this was prior to her seeing Dr. Sandria Manly-- and she states that it didn't help her migraines. Shanda Bumps is this sufficient documentation? I may have to research centricity to see if Dr. Sandria Manly mentioned this in his notes. Of course I don't have Dr. Marily Lente notes since he is not at this office.

## 2015-02-06 NOTE — Addendum Note (Signed)
Addended by: Enedina Finner on: 02/06/2015 05:05 PM   Modules accepted: Orders, Medications

## 2015-02-11 ENCOUNTER — Other Ambulatory Visit: Payer: Self-pay | Admitting: Neurology

## 2015-02-18 ENCOUNTER — Other Ambulatory Visit: Payer: Self-pay

## 2015-02-18 MED ORDER — PREGABALIN 50 MG PO CAPS: ORAL_CAPSULE | ORAL | Status: DC

## 2015-02-19 NOTE — Telephone Encounter (Signed)
Rx signed and faxed.

## 2015-05-31 ENCOUNTER — Other Ambulatory Visit: Payer: Self-pay | Admitting: Neurology

## 2015-07-04 ENCOUNTER — Ambulatory Visit (INDEPENDENT_AMBULATORY_CARE_PROVIDER_SITE_OTHER): Payer: BLUE CROSS/BLUE SHIELD | Admitting: Adult Health

## 2015-07-04 ENCOUNTER — Encounter: Payer: Self-pay | Admitting: Adult Health

## 2015-07-04 VITALS — BP 103/60 | HR 71 | Ht 63.0 in | Wt 161.0 lb

## 2015-07-04 DIAGNOSIS — R51 Headache: Secondary | ICD-10-CM | POA: Diagnosis not present

## 2015-07-04 DIAGNOSIS — M797 Fibromyalgia: Secondary | ICD-10-CM | POA: Diagnosis not present

## 2015-07-04 DIAGNOSIS — R413 Other amnesia: Secondary | ICD-10-CM

## 2015-07-04 DIAGNOSIS — R519 Headache, unspecified: Secondary | ICD-10-CM

## 2015-07-04 NOTE — Progress Notes (Signed)
PATIENT: [name] DOB: 11/04/1956  REASON FOR VISIT: follow up-fibromyalgia, memory, migraines HISTORY FROM: patient  HISTORY OF PRESENT ILLNESS: [name] is a 59 year old female with a history of fibromyalgia, memory disturbance and migraine headaches. She returns today for follow-up. At the last visit the patient was having increase in migraine headaches. Her Topamax was increased to 50 mg in the a.m. and 100 mg in the PM. She reports that this has been beneficial. She is not had any headaches in the last couple of weeks. She states that her headaches are normally caused by anxiety. The patient continues to take Lyrica, Robaxin, Tylenol No. 3 and a compound cream for discomfort related to fibromyalgia. She states that this works well. Patient also has rheumatoid arthritis. Her rheumatologist changed practices and she is trying to get set up for her infusion. She states that her discomfort is worse when she's not had her infusion. The patient feels that her memory has remained the same. She continues to have issues with word finding and processing information. She states that this began 5 years ago. She states that this was prior to the Topamax. She does not feel the Topamax has exacerbated the symptoms. She is able to complete all ADLs independently. She operates a Librarian, academic without difficulty. She returns today for an evaluation.  HISTORY 12/31/14: [name] is a 59 year old white female with a history of fibromyalgia. She returns today for follow-up. She is currently taking lyrica and is tolerating it well. She also uses a compound cream that works well for her. She also takes Robaxin and Tylenol #3. She reports that she was doing well until 3 months ago she began to have right occiptal nerve pain. She states that the pain will radiate from the right occpital nerve to the shoulder. She continues to see a massage therapist and they have been working in the occiptal area to help  relieve the pain. Patient also has a history of migraines. She uses topamax and Relpax. She reports that she has approximately 2-4 migraines a month but she states this can vary. Her headaches are normally located in thetemporal area bilaterally and sometimes in the occpital area. + nausea denies vomiting. + photophobia and phonophobia. No new medical issues since last seen.   HISTORY 06/28/14: [name] is a 59 year old white female with a history of fibromyalgia. She returns today for follow-up. She is currently taking lyrica and is tolerating it well. She also uses a compound cream that works well for her. At the last visit she was prescribed Robaxin and Tylenol #3. Patient has a history of anxiety, depression and PTSD. She is followed by a psychiatrist. Patient states things are going better than it has been. She is getting three massages a week and that has been very helpful. Patient has back and neck pain that is relieved by the massages. Patient states that overall she is pain free right now with the combination of medications and massages. Patient continues to have issues with processing information. Patient states that she has different routines for when she gets overwhelmed with information. Patient states that she is now sleeping very well. Overall patient feels like she doing well and that pain is well controlled. No new medical issues since last seen.    REVIEW OF SYSTEMS: Out of a complete 14 system review of symptoms, the patient complains only of the following symptoms, and all other reviewed systems are negative.  See history of present illness ALLERGIES: Allergies  Allergen Reactions  . Asa [Aspirin] Shortness Of Breath and Swelling    Wheezing and throat swells  . Ibuprofen Other (See Comments) and Swelling    Wheezing and throat swells Wheezing and throat swells    HOME MEDICATIONS: Outpatient Prescriptions Prior to Visit  Medication Sig Dispense Refill  .  acetaminophen-codeine (TYLENOL #3) 300-30 MG per tablet Take 1 tablet by mouth every 6 (six) hours as needed for moderate pain. 180 tablet 0  . azelastine (OPTIVAR) 0.05 % ophthalmic solution Place 0.05 drops into both eyes daily.    Marland Kitchen b complex vitamins capsule Take 1 capsule by mouth daily.    . busPIRone (BUSPAR) 30 MG tablet Take 30 mg by mouth 2 (two) times daily.     . cholecalciferol (VITAMIN D) 1000 UNITS tablet Take 1,000 Units by mouth daily.    . DULoxetine (CYMBALTA) 30 MG capsule Take 30 mg by mouth daily.     Marland Kitchen estradiol (ESTRACE) 0.1 MG/GM vaginal cream Place 2 g vaginally. M, W, F    . fenofibrate (TRICOR) 48 MG tablet Take 48 mg by mouth daily.     . Ferrous Fumarate (IRON) 18 MG TBCR Take 18 mg by mouth daily.    . fluconazole (DIFLUCAN) 100 MG tablet Take 100 mg by mouth daily.    . folic acid (FOLVITE) 1 MG tablet Take 1 mg by mouth daily.     . Glucosamine-Chondroitin-MSM 500-250-250 MG CAPS Take 1 tablet by mouth daily.    . hydrochlorothiazide (HYDRODIURIL) 25 MG tablet Take 25 mg by mouth daily.     Marland Kitchen HYDROcodone-acetaminophen (NORCO/VICODIN) 5-325 MG per tablet Take 1 tablet by mouth every 6 (six) hours as needed for pain.    . hydrocortisone valerate cream (WESTCORT) 0.2 % Apply 0.2 g topically daily.    . hyoscyamine (ANASPAZ) 0.125 MG TBDP Place 0.125 mg under the tongue every 4 (four) hours as needed.    Marland Kitchen LORazepam (ATIVAN) 0.5 MG tablet Take 0.5 mg by mouth 2 (two) times daily.     . Magnesium 100 MG TABS Take 100 mg by mouth. 2 tabs in a.m; 2 tabs in pm    . methocarbamol (ROBAXIN) 750 MG tablet TAKE 1 BY MOUTH 3 TIMES DAILY 270 tablet 1  . Methotrexate Sodium, PF, 50 MG/2ML SOLN Inject 25 mg as directed once a week.     . Misc Natural Products (GLUCOSAMINE CHOND COMPLEX/MSM) TABS Take 1 tablet by mouth daily as needed.    . Multiple Vitamin (MULTIVITAMIN) capsule Take 1 capsule by mouth daily.    . Multiple Vitamins-Minerals (MULTIVITAMIN PO) Take 1 tablet by  mouth daily.    Marland Kitchen NASONEX 50 MCG/ACT nasal spray Place 2 sprays into the nose daily.     . Omega-3 Fatty Acids (FISH OIL) 1000 MG CAPS Take 1,000 mg by mouth daily.    Marland Kitchen omeprazole (PRILOSEC) 40 MG capsule Take 40 mg by mouth 2 (two) times daily.     . pregabalin (LYRICA) 50 MG capsule Two capsules po in am, one cap at noon and two caps at bedtime 450 capsule 1  . Probiotic Product (PROBIOTIC DAILY) CAPS Take by mouth.    . rizatriptan (MAXALT-MLT) 10 MG disintegrating tablet Take 1 tablet (10 mg total) by mouth as needed for migraine. May repeat in 2 hours if needed 9 tablet 11  . rosuvastatin (CRESTOR) 10 MG tablet Take 10 mg by mouth daily.    . sertraline (ZOLOFT) 100 MG tablet Take 200 mg  by mouth daily.     . Tocilizumab (ACTEMRA IV) Inject into the vein.    Marland Kitchen topiramate (TOPAMAX) 50 MG tablet TAKE 1 BY MOUTH EVERY MORNING AND TAKE 2 BY MOUTH AT BEDTIME --M Chin Wachter 270 tablet 0   No facility-administered medications prior to visit.    PAST MEDICAL HISTORY: Past Medical History  Diagnosis Date  . Myalgia and myositis, unspecified   . Chronic interstitial cystitis   . Memory loss   . Cervical spondylosis with myelopathy   . Other malaise and fatigue   . Rheumatoid arthritis(714.0)   . Migraine without aura, without mention of intractable migraine without mention of status migrainosus   . Myalgia and myositis, unspecified   . Diplopia   . Memory disorder   . PTSD (post-traumatic stress disorder)   . Anxiety and depression   . Memory deficit 12/27/2013    PAST SURGICAL HISTORY: Past Surgical History  Procedure Laterality Date  . Lumbar laminectomy Left 1993  . Abdominal hysterectomy    . Cesarean section      FAMILY HISTORY: Family History  Problem Relation Age of Onset  . Heart failure Father   . Hypertension Mother     SOCIAL HISTORY: Social History   Social History  . Marital Status: Married    Spouse Name: N/A  . Number of Children: N/A  . Years of  Education: N/A   Occupational History  . Not on file.   Social History Main Topics  . Smoking status: Never Smoker   . Smokeless tobacco: Never Used  . Alcohol Use: No  . Drug Use: No  . Sexual Activity: Not on file   Other Topics Concern  . Not on file   Social History Narrative      PHYSICAL EXAM  Filed Vitals:   07/04/15 1500  BP: 103/60  Pulse: 71  Height: 5\' 3"  (1.6 m)  Weight: 161 lb (73.029 kg)   Body mass index is 28.53 kg/(m^2).   Generalized: Well developed, in no acute distress   Neurological examination  Mentation: Alert oriented to time, place, history taking. Follows all commands speech and language fluent. MMSE 28/30. Cranial nerve II-XII: Pupils were equal round reactive to light. Extraocular movements were full, visual field were full on confrontational test. Facial sensation and strength were normal. Uvula tongue midline. Head turning and shoulder shrug  were normal and symmetric. Motor: The motor testing reveals 5 over 5 strength of all 4 extremities. Good symmetric motor tone is noted throughout.  Sensory: Sensory testing is intact to soft touch on all 4 extremities. No evidence of extinction is noted.  Coordination: Cerebellar testing reveals good finger-nose-finger and heel-to-shin bilaterally.  Gait and station: Gait is normal. Tandem gait is normal. Romberg is negative. No drift is seen.  Reflexes: Deep tendon reflexes are symmetric and normal bilaterally.   DIAGNOSTIC DATA (LABS, IMAGING, TESTING) - I reviewed patient records, labs, notes, testing and imaging myself where available.                                                                             ASSESSMENT AND PLAN 59 y.o. year old female  has a past medical history of Myalgia and  myositis, unspecified; Chronic interstitial cystitis; Memory loss; Cervical spondylosis with myelopathy; Other malaise and fatigue; Rheumatoid arthritis(714.0); Migraine without aura, without mention of  intractable migraine without mention of status migrainosus; Myalgia and myositis, unspecified; Diplopia; Memory disorder; PTSD (post-traumatic stress disorder); Anxiety and depression; and Memory deficit (12/27/2013). here with:  1. Fibromyalgia 2. Migraine headaches                                                3. Memory disturbance  Overall the patient is doing well. She will continue on Topamax 50 mg in the morning and 100 mg in the evening. The patient's memory has remained stable. Her MMSE is 28/30 was previously 28/30. She continues to use Lyrica, Tylenol 3 and compound cream for pain and discomfort related to fibromyalgia. Patient advised that if her symptoms worsen or she develops new symptoms she should let us know. She will follow-up in 6 months with Dr. Anne Hahn.   Butch Penny, MSN, NP-C 07/04/2015, 3:09 PM [facility] [address] [place], [place] [address] [phone]

## 2015-07-04 NOTE — Progress Notes (Signed)
I have read the note, and I agree with the clinical assessment and plan.  Sharan Mcenaney KEITH   

## 2015-07-04 NOTE — Patient Instructions (Signed)
Continue Topamax If your symptoms worsen or you develop new symptoms please let us know.   

## 2015-07-23 ENCOUNTER — Other Ambulatory Visit: Payer: Self-pay | Admitting: Neurology

## 2015-07-23 ENCOUNTER — Telehealth: Payer: Self-pay

## 2015-07-23 MED ORDER — TOPIRAMATE 50 MG PO TABS: ORAL_TABLET | ORAL | Status: DC

## 2015-07-23 MED ORDER — ACETAMINOPHEN-CODEINE #3 300-30 MG PO TABS: 1.0000 | ORAL_TABLET | Freq: Four times a day (QID) | ORAL | Status: DC | PRN

## 2015-07-23 NOTE — Telephone Encounter (Signed)
Rx ready for pick up. 

## 2015-07-23 NOTE — Telephone Encounter (Signed)
Rx signed and faxed.

## 2015-12-09 ENCOUNTER — Other Ambulatory Visit: Payer: Self-pay | Admitting: Neurology

## 2015-12-10 NOTE — Progress Notes (Signed)
Medication prescription lyrica fax to [facility] pharmacy

## 2015-12-30 ENCOUNTER — Other Ambulatory Visit: Payer: Self-pay | Admitting: Neurology

## 2016-01-02 ENCOUNTER — Telehealth: Payer: Self-pay | Admitting: Neurology

## 2016-01-02 NOTE — Telephone Encounter (Signed)
Patient called regarding message sent through MyChart in response to Dr. Clarisa Kindred message that she is due for Hep-C screening. Patient "doesn't know if there is a time that the screening has to be completed by" also states Dr. Anne Hahn will need to order this. Please call to advise [phone].

## 2016-01-02 NOTE — Telephone Encounter (Signed)
I called the patient. I advised that we do not need a Hep-C screening. If she would like to have this done, she can see her PCP. She stated she does not want it done and I advised that she does not have to have it done for our office.

## 2016-01-07 ENCOUNTER — Encounter: Payer: Self-pay | Admitting: Neurology

## 2016-01-07 ENCOUNTER — Other Ambulatory Visit: Payer: Self-pay | Admitting: Neurology

## 2016-01-07 ENCOUNTER — Ambulatory Visit (INDEPENDENT_AMBULATORY_CARE_PROVIDER_SITE_OTHER): Payer: BLUE CROSS/BLUE SHIELD | Admitting: Neurology

## 2016-01-07 VITALS — BP 130/78 | HR 76 | Ht 65.0 in | Wt 185.0 lb

## 2016-01-07 DIAGNOSIS — M609 Myositis, unspecified: Secondary | ICD-10-CM

## 2016-01-07 DIAGNOSIS — R413 Other amnesia: Secondary | ICD-10-CM | POA: Diagnosis not present

## 2016-01-07 DIAGNOSIS — S161XXD Strain of muscle, fascia and tendon at neck level, subsequent encounter: Secondary | ICD-10-CM

## 2016-01-07 DIAGNOSIS — M791 Myalgia: Secondary | ICD-10-CM | POA: Diagnosis not present

## 2016-01-07 DIAGNOSIS — IMO0001 Reserved for inherently not codable concepts without codable children: Secondary | ICD-10-CM

## 2016-01-07 DIAGNOSIS — M47812 Spondylosis without myelopathy or radiculopathy, cervical region: Secondary | ICD-10-CM | POA: Diagnosis not present

## 2016-01-07 MED ORDER — RIZATRIPTAN BENZOATE 10 MG PO TBDP: 10.0000 mg | ORAL_TABLET | ORAL | Status: DC | PRN

## 2016-01-07 MED ORDER — PREGABALIN 100 MG PO CAPS: 100.0000 mg | ORAL_CAPSULE | Freq: Three times a day (TID) | ORAL | Status: DC

## 2016-01-07 MED ORDER — BETAMETHASONE SOD PHOS & ACET 6 (3-3) MG/ML IJ SUSP
9.0000 mg | Freq: Once | INTRAMUSCULAR | Status: AC
  Administered 2016-01-07: 9 mg via INTRAMUSCULAR

## 2016-01-07 NOTE — Procedures (Signed)
     History: [name] is a 60 year old patient with a history of fibromyalgia, cervical spondylosis. She reports a six-month history of increased pain in the right neck and shoulder with trigger points. She comes in today for an evaluation, and trigger point injections are done to help the discomfort.  Procedure:  A one-to-one mixture of betamethasone and bupivacaine was administered, total volume of 3 mL. Bupivacaine is 0.5%, the betamethasone is 30 mg per 5 mL.  Trigger point injections were done at 4 locations, one half cc was administered at the insertion point of the cervical paraspinal muscles at the skull base, one half cc was administered in the midportion of the shoulder in the trapezius muscle, 1 mL was administered in the upper trapezius in 2 locations, one around 4 cm from the skull base, and another at approximately 8 cm from the skull base along the trapezius muscle. All injections were on the right side.  The patient tolerated the procedure well, no complications of the procedure were noted.  Bupivacaine NDC 04 0 9-1 16 2-0 1 Lot #[id number] Expiration date 06/23/2017  Betamethasone 30 mg per 5 mL  NDC 0517-0720-01 Lot #[id number] Expiration date April 2018

## 2016-01-07 NOTE — Progress Notes (Signed)
Reason for visit: Fibromyalgia  [name] is an 60 y.o. female  History of present illness:  [name] is a 60 year old right-handed white female with a history of fibromyalgia. The patient has had an increase in her neck discomfort on the right side since the summer of 2016. This has been improved with the muscle relaxants and with massage therapy, but the patient cannot afford to continue the massage treatments. The patient denies any pain down the right arm. She does have some neck stiffness. MRI cervical evaluations in the past have revealed some disc disease at C5-6 and C6-7 levels. The patient is on Lyrica for discomfort as well, and she takes Robaxin. The patient has migraine headaches and she is on Topamax and Maxalt for this. She is doing relatively well in this regard. The patient returns this office for an evaluation. The patient reports some mild memory issues, no significant change from last visit.   Past Medical History  Diagnosis Date  . Myalgia and myositis, unspecified   . Chronic interstitial cystitis   . Memory loss   . Cervical spondylosis with myelopathy   . Other malaise and fatigue   . Rheumatoid arthritis(714.0)   . Migraine without aura, without mention of intractable migraine without mention of status migrainosus   . Myalgia and myositis, unspecified   . Diplopia   . Memory disorder   . PTSD (post-traumatic stress disorder)   . Anxiety and depression   . Memory deficit 12/27/2013    Past Surgical History  Procedure Laterality Date  . Lumbar laminectomy Left 1993  . Abdominal hysterectomy    . Cesarean section      Family History  Problem Relation Age of Onset  . Heart failure Father   . Hypertension Mother     Social history:  reports that she has never smoked. She has never used smokeless tobacco. She reports that she does not drink alcohol or use illicit drugs.    Allergies  Allergen Reactions  . Asa [Aspirin] Shortness Of Breath  and Swelling    Wheezing and throat swells  . Ibuprofen Other (See Comments) and Swelling    Wheezing and throat swells Wheezing and throat swells    Medications:  Prior to Admission medications   Medication Sig Start Date End Date Taking? Authorizing Provider  acetaminophen-codeine (TYLENOL #3) 300-30 MG per tablet Take 1 tablet by mouth every 6 (six) hours as needed for moderate pain. 07/23/15   York Spaniel, MD  azelastine (OPTIVAR) 0.05 % ophthalmic solution Place 0.05 drops into both eyes daily. 06/04/14   Historical Provider, MD  b complex vitamins capsule Take 1 capsule by mouth daily.    Historical Provider, MD  busPIRone (BUSPAR) 30 MG tablet Take 30 mg by mouth 2 (two) times daily.  05/09/13   Historical Provider, MD  cholecalciferol (VITAMIN D) 1000 UNITS tablet Take 1,000 Units by mouth daily.    Historical Provider, MD  DULoxetine (CYMBALTA) 30 MG capsule Take 30 mg by mouth daily.  05/08/13   Historical Provider, MD  estradiol (ESTRACE) 0.5 MG tablet Take 0.5 mg by mouth daily.    Historical Provider, MD  fenofibrate (TRICOR) 48 MG tablet Take 48 mg by mouth daily.  05/15/13   Historical Provider, MD  folic acid (FOLVITE) 1 MG tablet Take 1 mg by mouth daily.  03/13/13   Historical Provider, MD  Glucosamine-Chondroitin-MSM 500-250-250 MG CAPS Take 1 tablet by mouth daily.    Historical Provider, MD  hydrochlorothiazide (HYDRODIURIL) 25 MG tablet Take 25 mg by mouth daily.  03/24/13   Historical Provider, MD  HYDROcodone-acetaminophen (NORCO/VICODIN) 5-325 MG per tablet Take 1 tablet by mouth every 6 (six) hours as needed for pain.    Historical Provider, MD  hydrocortisone valerate cream (WESTCORT) 0.2 % Apply 0.2 g topically daily. 04/25/14   Historical Provider, MD  hyoscyamine (ANASPAZ) 0.125 MG TBDP Place 0.125 mg under the tongue every 4 (four) hours as needed.    Historical Provider, MD  LORazepam (ATIVAN) 0.5 MG tablet Take 0.5 mg by mouth 2 (two) times daily.  04/24/13    Historical Provider, MD  LYRICA 50 MG capsule TAKE 2 BY MOUTH IN THE MORNING, 1 BY MOUTH AT NOON, AND 2 BY MOUTH AT BEDTIME 12/09/15   York Spaniel, MD  Magnesium 100 MG TABS Take 100 mg by mouth. 2 tabs in a.m; 2 tabs in pm    Historical Provider, MD  methocarbamol (ROBAXIN) 750 MG tablet TAKE 1 BY MOUTH 3 TIMES DAILY 12/31/15   York Spaniel, MD  methotrexate (RHEUMATREX) 2.5 MG tablet Take 7.5 mg by mouth. Once per week.    Historical Provider, MD  Misc Natural Products (GLUCOSAMINE CHOND COMPLEX/MSM) TABS Take 1 tablet by mouth daily as needed.    Historical Provider, MD  Multiple Vitamin (MULTIVITAMIN) capsule Take 1 capsule by mouth daily.    Historical Provider, MD  Multiple Vitamins-Minerals (MULTIVITAMIN PO) Take 1 tablet by mouth daily. 04/24/10   Historical Provider, MD  NASONEX 50 MCG/ACT nasal spray Place 2 sprays into the nose daily.  05/04/13   Historical Provider, MD  Omega-3 Fatty Acids (FISH OIL) 1000 MG CAPS Take 1,000 mg by mouth daily.    Historical Provider, MD  omeprazole (PRILOSEC) 40 MG capsule Take 40 mg by mouth 2 (two) times daily.  05/08/13   Historical Provider, MD  Probiotic Product (PROBIOTIC DAILY) CAPS Take by mouth.    Historical Provider, MD  rizatriptan (MAXALT-MLT) 10 MG disintegrating tablet Take 1 tablet (10 mg total) by mouth as needed for migraine. May repeat in 2 hours if needed 02/06/15   Butch Penny, NP  rosuvastatin (CRESTOR) 10 MG tablet Take 10 mg by mouth daily.    Historical Provider, MD  sertraline (ZOLOFT) 100 MG tablet Take 200 mg by mouth daily.  05/06/13   Historical Provider, MD  Tocilizumab (ACTEMRA IV) Inject into the vein.    Historical Provider, MD  topiramate (TOPAMAX) 50 MG tablet TAKE 1 BY MOUTH EVERY MORNING AND TAKE 2 BY MOUTH AT BEDTIME 12/31/15   York Spaniel, MD    ROS:  Out of a complete 14 system review of symptoms, the patient complains only of the following symptoms, and all other reviewed systems are negative.  Neck  pain, low back pain Headache  Blood pressure 130/78, pulse 76, height 5\' 5"  (1.651 m), weight 185 lb (83.915 kg).  Physical Exam  General: The patient is alert and cooperative at the time of the examination.   Neuromuscular: Range of movement of the cervical spine is relatively normal.  Skin: No significant peripheral edema is noted.   Neurologic Exam  Mental status: The patient is alert and oriented x 3 at the time of the examination. The patient has apparent normal recent and remote memory, with an apparently normal attention span and concentration ability. The Mini-Mental Status Examination done today shows a total score of 30/30. The patient is able to name 10 animals in 30 seconds.  Cranial nerves: Facial symmetry is present. Speech is normal, no aphasia or dysarthria is noted. Extraocular movements are full. Visual fields are full.  Motor: The patient has good strength in all 4 extremities.  Sensory examination: Soft touch sensation is symmetric on the face, arms, and legs.  Coordination: The patient has good finger-nose-finger and heel-to-shin bilaterally.  Gait and station: The patient has a normal gait. Tandem gait is normal. Romberg is negative. No drift is seen.  Reflexes: Deep tendon reflexes are symmetric.   06/14/2006 MRI of the cervical spine showed diffuse degenerative disease at C5-6 and C6-7 with left paramedian disc protrusion at C6-7. She has increased cord signal at C6-7 without myelopathic signs.   Assessment/Plan:  1. Fibromyalgia  2. Right sided neck pain  3. Migraine headache  4. Mild memory disturbance  The patient will be increased on the Lyrica taking 100 mg 3 times daily. The patient will be set up for epidural steroid injection of the cervical spine, if the symptoms do not improve, we may consider a repeat MRI of the cervical spine. The last study was done in 2007. A prescription was given for the Maxalt and for the Lyrica. I recommended  discontinuance of the Tylenol # 3, as the patient also has a prescription for hydrocodone. The patient will follow-up in 6 months, sooner if needed. Trigger point injections were done on the right neck and shoulder today.  Marlan Palau MD 01/07/2016 5:00 PM  [facility] [address] [place], [place] [address]  Phone [phone] Fax [phone]

## 2016-01-07 NOTE — Patient Instructions (Addendum)
  We will get an epidural steroid injection of the neck and go up on the Lyrica to 100 mg three times a day.   Musculoskeletal Pain Musculoskeletal pain is muscle and boney aches and pains. These pains can occur in any part of the body. Your caregiver may treat you without knowing the cause of the pain. They may treat you if blood or urine tests, X-rays, and other tests were normal.  CAUSES There is often not a definite cause or reason for these pains. These pains may be caused by a type of germ (virus). The discomfort may also come from overuse. Overuse includes working out too hard when your body is not fit. Boney aches also come from weather changes. Bone is sensitive to atmospheric pressure changes. HOME CARE INSTRUCTIONS   Ask when your test results will be ready. Make sure you get your test results.  Only take over-the-counter or prescription medicines for pain, discomfort, or fever as directed by your caregiver. If you were given medications for your condition, do not drive, operate machinery or power tools, or sign legal documents for 24 hours. Do not drink alcohol. Do not take sleeping pills or other medications that may interfere with treatment.  Continue all activities unless the activities cause more pain. When the pain lessens, slowly resume normal activities. Gradually increase the intensity and duration of the activities or exercise.  During periods of severe pain, bed rest may be helpful. Lay or sit in any position that is comfortable.  Putting ice on the injured area.  Put ice in a bag.  Place a towel between your skin and the bag.  Leave the ice on for 15 to 20 minutes, 3 to 4 times a day.  Follow up with your caregiver for continued problems and no reason can be found for the pain. If the pain becomes worse or does not go away, it may be necessary to repeat tests or do additional testing. Your caregiver may need to look further for a possible cause. SEEK IMMEDIATE MEDICAL  CARE IF:  You have pain that is getting worse and is not relieved by medications.  You develop chest pain that is associated with shortness or breath, sweating, feeling sick to your stomach (nauseous), or throw up (vomit).  Your pain becomes localized to the abdomen.  You develop any new symptoms that seem different or that concern you. MAKE SURE YOU:   Understand these instructions.  Will watch your condition.  Will get help right away if you are not doing well or get worse.   This information is not intended to replace advice given to you by your health care provider. Make sure you discuss any questions you have with your health care provider.   Document Released: 11/09/2005 Document Revised: 02/01/2012 Document Reviewed: 07/14/2013 Elsevier Interactive Patient Education [employer]

## 2016-01-08 ENCOUNTER — Telehealth: Payer: Self-pay | Admitting: Neurology

## 2016-01-08 NOTE — Telephone Encounter (Signed)
The patient last had MRI evaluation at [facility], this was done in 2010. The patient had another MRI of the cervical spine done at [facility] in 2007.  I called [facility], left a message. If they need another scan done more recently, they are to call me back.  Radiology Report   Referring Physician: Avie Echevaria MD  Exam:  MRI of the Cervical Spine  Clinical History:  25 year lady with cervical degenerative disc disease with worsening symptoms  IV Contrast: Yes  15 cc of  Magnevist   MRI scan of the cervical spine is performed on a 1.5 Telsa Magnet at [facility] at [employer].  Sagital T1, T2, STIR and axial T2 images and gradient echo imagers were obtained through cervical spine.  Cervical vertebrae demonstrate abnormal alignment with loss of forward curvature and disc degenerative changes mostly at C5-6 and C6-7 but normal  body height, and bone marrow signal characteristics.  There ar eminor disc proyusions at C2-3, C3-4 without significant stenosis.C4-5 shows no abnormalities. C5-6 shows central disc protusion without significant stenosis or foraminal enchroachment. C6-7 shows large paracentral disc  protusion with effacemant of thecal sac and only mild canal stenosis.Marland KitchenSpinal cord parenchyma shows no abnormal signal characteristics except subtle cord hyperintensity at C6-7.  The visualized portion of the lower brainstem, cerebellum, and craniocervical junction appear unremarkable.  Impression: This MRI scan of the cervical spine shows prominent degenerative changes at C6-7 with paracentral disc herniation with only mild canal stenosis and  mild disc protusion at C5-6.

## 2016-01-08 NOTE — Telephone Encounter (Signed)
[facility] [phone] called to advise, patient needs to have had a CT cervical spine or MRI cervical spine within the past few years before they can do cervical epidural injection, it does not appear that patient has had either.

## 2016-01-15 ENCOUNTER — Ambulatory Visit
Admission: RE | Admit: 2016-01-15 | Discharge: 2016-01-15 | Disposition: A | Discharge status: Home / Self Care | Payer: BLUE CROSS/BLUE SHIELD | Source: Ambulatory Visit | Attending: Neurology | Admitting: Neurology

## 2016-01-15 DIAGNOSIS — M47812 Spondylosis without myelopathy or radiculopathy, cervical region: Secondary | ICD-10-CM

## 2016-01-15 MED ORDER — TRIAMCINOLONE ACETONIDE 40 MG/ML IJ SUSP (RADIOLOGY)
60.0000 mg | Freq: Once | INTRAMUSCULAR | Status: AC
  Administered 2016-01-15: 60 mg via EPIDURAL

## 2016-01-15 MED ORDER — IOHEXOL 300 MG/ML  SOLN
1.0000 mL | Freq: Once | INTRAMUSCULAR | Status: AC | PRN
  Administered 2016-01-15: 1 mL via EPIDURAL

## 2016-01-15 NOTE — Discharge Instructions (Signed)

## 2016-02-05 ENCOUNTER — Other Ambulatory Visit: Payer: Self-pay | Admitting: Neurology

## 2016-02-19 NOTE — Telephone Encounter (Signed)
Error

## 2016-06-09 ENCOUNTER — Telehealth: Payer: Self-pay | Admitting: Neurology

## 2016-06-09 MED ORDER — PREGABALIN 100 MG PO CAPS: 100.0000 mg | ORAL_CAPSULE | Freq: Three times a day (TID) | ORAL | Status: DC

## 2016-06-09 NOTE — Telephone Encounter (Signed)
Rx printed, awaiting MD signature.  

## 2016-06-09 NOTE — Telephone Encounter (Signed)
Patient called to request refill of pregabalin (LYRICA) 100 MG capsule to [facility] [address] [place]

## 2016-06-10 NOTE — Telephone Encounter (Signed)
Rx reprinted, signed by Dr. Anne Hahn, faxed to pharmacy.

## 2016-06-16 ENCOUNTER — Other Ambulatory Visit: Payer: Self-pay | Admitting: *Deleted

## 2016-06-16 MED ORDER — PREGABALIN 100 MG PO CAPS: 100.0000 mg | ORAL_CAPSULE | Freq: Three times a day (TID) | ORAL | 1 refills | Status: DC

## 2016-06-16 MED ORDER — TOPIRAMATE 50 MG PO TABS: ORAL_TABLET | ORAL | 1 refills | Status: DC

## 2016-06-16 MED ORDER — METHOCARBAMOL 750 MG PO TABS: ORAL_TABLET | ORAL | 1 refills | Status: DC

## 2016-07-06 ENCOUNTER — Ambulatory Visit (INDEPENDENT_AMBULATORY_CARE_PROVIDER_SITE_OTHER): Payer: BLUE CROSS/BLUE SHIELD | Admitting: Adult Health

## 2016-07-06 ENCOUNTER — Telehealth: Payer: Self-pay

## 2016-07-06 ENCOUNTER — Encounter: Payer: Self-pay | Admitting: Adult Health

## 2016-07-06 VITALS — BP 132/81 | HR 74 | Resp 20 | Ht 65.0 in | Wt 190.0 lb

## 2016-07-06 DIAGNOSIS — M797 Fibromyalgia: Secondary | ICD-10-CM

## 2016-07-06 DIAGNOSIS — G43009 Migraine without aura, not intractable, without status migrainosus: Secondary | ICD-10-CM | POA: Diagnosis not present

## 2016-07-06 NOTE — Telephone Encounter (Signed)
Spoke to patient to reschedule appt tomorrow due to provider being out. I was able to get her in this afternoon.

## 2016-07-06 NOTE — Progress Notes (Signed)
PATIENT: [name] DOB: Feb 11, 1956  REASON FOR VISIT: follow up- fibromyalgia, neck pain, migraine headaches HISTORY FROM: patient  HISTORY OF PRESENT ILLNESS: [name] is a 60 year old female with a history of fibromyalgia, neck pain and migraine headaches. She returns today for follow-up. She reports that her neck pain  is "100% better." She states that she did have epidural steroid injections and trigger point injections. She states both were beneficial but did not last very long. She states what has helped the most that she changed her pillow. The patient reports that her headaches seem to be controlled with Topamax and Maxalt. She has approximately 2 headaches a month. She notes with fibromyalgia some benefit with Lyrica although she continues to notice a lack of energy. She also has rheumatoid arthritis and tends to notice the change in her energy around her infusion time. She follows with a rheumatologist. She has nerve conduction studies with EMG scheduled in October for numbness in the lower extremities. She returns today for evaluation.  HISTORY 01/07/16 (WILLIS):[name] is a 60 year old right-handed white female with a history of fibromyalgia. The patient has had an increase in her neck discomfort on the right side since the summer of 2016. This has been improved with the muscle relaxants and with massage therapy, but the patient cannot afford to continue the massage treatments. The patient denies any pain down the right arm. She does have some neck stiffness. MRI cervical evaluations in the past have revealed some disc disease at C5-6 and C6-7 levels. The patient is on Lyrica for discomfort as well, and she takes Robaxin. The patient has migraine headaches and she is on Topamax and Maxalt for this. She is doing relatively well in this regard. The patient returns this office for an evaluation. The patient reports some mild memory issues, no significant change from last visit.     REVIEW OF SYSTEMS: Out of a complete 14 system review of symptoms, the patient complains only of the following symptoms, and all other reviewed systems are negative.  Eye redness, light sensitivity, eye pain, wheezing, ear pain, runny nose, trouble swallowing, fatigue, sleep talking, acting out dreams, daytime sleepiness, neck pain, neck stiffness, walking difficulty, muscle cramps, aching muscles, back pain, joint pain, memory loss, headache, numbness, speech difficulty, confusion, decreased concentration, depression, nervous/anxious, hallucinations  ALLERGIES: Allergies  Allergen Reactions  . Asa [Aspirin] Shortness Of Breath and Swelling    Wheezing and throat swells  . Ibuprofen Other (See Comments) and Swelling    Wheezing and throat swells Wheezing and throat swells    HOME MEDICATIONS: Outpatient Medications Prior to Visit  Medication Sig Dispense Refill  . acetaminophen-codeine (TYLENOL #3) 300-30 MG per tablet Take 1 tablet by mouth every 6 (six) hours as needed for moderate pain. 180 tablet 1  . azelastine (OPTIVAR) 0.05 % ophthalmic solution Place 0.05 drops into both eyes daily.    Marland Kitchen b complex vitamins capsule Take 1 capsule by mouth daily.    Marland Kitchen Bioflavonoid Products (C COMPLEX PO) Take 400 mg by mouth daily.    . busPIRone (BUSPAR) 30 MG tablet Take 30 mg by mouth 2 (two) times daily.     . diphenhydrAMINE (BENADRYL) 12.5 MG chewable tablet Chew 12.5 mg by mouth daily.     . DULoxetine (CYMBALTA) 30 MG capsule Take 30 mg by mouth 3 (three) times daily.     Marland Kitchen estradiol (ESTRACE) 0.5 MG tablet Take 1 mg by mouth daily.     Marland Kitchen  fenofibrate (TRICOR) 48 MG tablet Take 48 mg by mouth daily.     . fexofenadine (ALLEGRA) 180 MG tablet Take 180 mg by mouth daily.    . folic acid (FOLVITE) 1 MG tablet Take 1 mg by mouth daily.     . Glucosamine-Chondroitin-MSM 500-250-250 MG CAPS Take 3 tablets by mouth daily.     . hydrochlorothiazide (HYDRODIURIL) 25 MG tablet Take 25 mg by  mouth daily.     Marland Kitchen HYDROcodone-acetaminophen (NORCO/VICODIN) 5-325 MG per tablet Take 1 tablet by mouth every 6 (six) hours as needed for pain.    . hydrocortisone valerate cream (WESTCORT) 0.2 % Apply 0.2 g topically daily.    . hyoscyamine (ANASPAZ) 0.125 MG TBDP Place 0.125 mg under the tongue every 4 (four) hours as needed.    . Ivermectin (SOOLANTRA) 1 % CREA Apply 1 application topically.    Marland Kitchen LORazepam (ATIVAN) 0.5 MG tablet Take 0.5 mg by mouth 2 (two) times daily.     . Magnesium 300 MG CAPS Take 2 capsules by mouth daily.    Marland Kitchen MALIC ACID PO Take 800 mg by mouth daily.    . methocarbamol (ROBAXIN) 750 MG tablet TAKE 1 BY MOUTH 3 TIMES DAILY 270 tablet 1  . methotrexate (RHEUMATREX) 2.5 MG tablet Take 7.5 mg by mouth. Once per week.    . Multiple Vitamin (MULTIVITAMIN) capsule Take 1 capsule by mouth daily.    Marland Kitchen omeprazole (PRILOSEC) 40 MG capsule Take 40 mg by mouth 2 (two) times daily.     . pregabalin (LYRICA) 100 MG capsule Take 1 capsule (100 mg total) by mouth 3 (three) times daily. 270 capsule 1  . Probiotic Product (PROBIOTIC DAILY) CAPS Take by mouth.    . rizatriptan (MAXALT-MLT) 10 MG disintegrating tablet Take 1 tablet (10 mg total) by mouth as needed for migraine. May repeat in 2 hours if needed 27 tablet 3  . rosuvastatin (CRESTOR) 10 MG tablet Take 10 mg by mouth daily.    . sertraline (ZOLOFT) 100 MG tablet Take 200 mg by mouth daily.     . Tocilizumab (ACTEMRA IV) Inject into the vein.    Marland Kitchen topiramate (TOPAMAX) 50 MG tablet TAKE 1 BY MOUTH EVERY MORNING AND TAKE 2 BY MOUTH AT BEDTIME 270 tablet 1  . cholecalciferol (VITAMIN D) 1000 UNITS tablet Take 1,000 Units by mouth daily.     No facility-administered medications prior to visit.     PAST MEDICAL HISTORY: Past Medical History:  Diagnosis Date  . Anxiety and depression   . Cervical spondylosis with myelopathy   . Chronic interstitial cystitis   . Diplopia   . Memory deficit 12/27/2013  . Memory disorder   .  Memory loss   . Migraine without aura, without mention of intractable migraine without mention of status migrainosus   . Myalgia and myositis, unspecified   . Myalgia and myositis, unspecified   . Other malaise and fatigue   . PTSD (post-traumatic stress disorder)   . Rheumatoid arthritis(714.0)     PAST SURGICAL HISTORY: Past Surgical History:  Procedure Laterality Date  . ABDOMINAL HYSTERECTOMY    . CESAREAN SECTION    . LUMBAR LAMINECTOMY Left 1993    FAMILY HISTORY: Family History  Problem Relation Age of Onset  . Heart failure Father   . Hypertension Mother     SOCIAL HISTORY: Social History   Social History  . Marital status: Married    Spouse name: Maisie Fus   . Number of children:  3  . Years of education: college   Occupational History  . Not on file.   Social History Main Topics  . Smoking status: Never Smoker  . Smokeless tobacco: Never Used  . Alcohol use No  . Drug use: No  . Sexual activity: Not on file   Other Topics Concern  . Not on file   Social History Narrative   Patient is married and lives at home with her husband Vinetta Bergamo).[place].   Patient does not work.    Education college.   Right handed.   Caffeine two cups of coffee daily.      PHYSICAL EXAM  Vitals:   07/06/16 1419  BP: 132/81  Pulse: 74  Resp: 20  Weight: 190 lb (86.2 kg)  Height: 5\' 5"  (1.651 m)   Body mass index is 31.62 kg/m.  Generalized: Well developed, in no acute distress   Neurological examination  Mentation: Alert oriented to time, place, history taking. Follows all commands speech and language fluent Cranial nerve II-XII: Pupils were equal round reactive to light. Extraocular movements were full, visual field were full on confrontational test. Facial sensation and strength were normal. Uvula tongue midline. Head turning and shoulder shrug  were normal and symmetric. Motor: The motor testing reveals 5 over 5 strength of all 4 extremities. Good symmetric motor  tone is noted throughout.  Sensory: Sensory testing is intact to soft touch on all 4 extremities. No evidence of extinction is noted.  Coordination: Cerebellar testing reveals good finger-nose-finger and heel-to-shin bilaterally.  Gait and station: Gait is normal. Tandem gait is unsteady. Romberg is negative. No drift is seen.  Reflexes: Deep tendon reflexes are symmetric and normal bilaterally.   DIAGNOSTIC DATA (LABS, IMAGING, TESTING) - I reviewed patient records, labs, notes, testing and imaging myself where available.      ASSESSMENT AND PLAN 60 y.o. year old female  has a past medical history of Anxiety and depression; Cervical spondylosis with myelopathy; Chronic interstitial cystitis; Diplopia; Memory deficit (12/27/2013); Memory disorder; Memory loss; Migraine without aura, without mention of intractable migraine without mention of status migrainosus; Myalgia and myositis, unspecified; Myalgia and myositis, unspecified; Other malaise and fatigue; PTSD (post-traumatic stress disorder); and Rheumatoid arthritis(714.0). here with:  1. Fibromyalgia 2. Neck pain 3. Migraine headaches  Overall the patient has remained stable. She will continue on Topamax and Maxalt for migraine headaches. She will continue using Lyrica and Robaxin for her fibromyalgia and neck pain. Patient advised that she should keep her appointment in October for nerve conduction studies. She'll follow-up in 6 months with Dr. November.     Anne Hahn, MSN, NP-C 07/06/2016, 2:23 PM [facility] [address] [place], [place] [phone]

## 2016-07-06 NOTE — Patient Instructions (Signed)
Continue Topamax, Lyrica and Maxalt Nerve conduction study with EMG oct 31st If your symptoms worsen or you develop new symptoms please let us know.

## 2016-07-07 ENCOUNTER — Ambulatory Visit: Payer: BLUE CROSS/BLUE SHIELD | Admitting: Adult Health

## 2016-07-07 NOTE — Progress Notes (Signed)
I have reviewed and agreed above plan. 

## 2016-07-17 ENCOUNTER — Telehealth: Payer: Self-pay | Admitting: Neurology

## 2016-07-17 NOTE — Telephone Encounter (Signed)
Pt has change in pharmacy prefference: [facility]. Phone: [phone]

## 2016-07-17 NOTE — Telephone Encounter (Signed)
Pharmacy information updated in pt's chart as requested.

## 2016-08-20 ENCOUNTER — Telehealth: Payer: Self-pay | Admitting: Neurology

## 2016-08-20 MED ORDER — RIZATRIPTAN BENZOATE 10 MG PO TBDP
10.0000 mg | ORAL_TABLET | ORAL | 3 refills | Status: DC | PRN
Start: 2016-08-20 — End: 2019-05-03

## 2016-08-20 NOTE — Telephone Encounter (Signed)
[facility] [phone] called to request refill of rizatriptan (MAXALT-MLT) 10 MG disintegrating tablet

## 2016-08-20 NOTE — Telephone Encounter (Signed)
Refills sent in to new mail order pharmacy as requested.

## 2016-09-07 ENCOUNTER — Encounter: Payer: Self-pay | Admitting: Adult Health

## 2016-09-12 ENCOUNTER — Encounter: Payer: Self-pay | Admitting: Adult Health

## 2016-09-22 ENCOUNTER — Encounter: Payer: Self-pay | Admitting: Neurology

## 2016-09-22 ENCOUNTER — Ambulatory Visit (INDEPENDENT_AMBULATORY_CARE_PROVIDER_SITE_OTHER): Payer: BLUE CROSS/BLUE SHIELD | Admitting: Neurology

## 2016-09-22 ENCOUNTER — Ambulatory Visit (INDEPENDENT_AMBULATORY_CARE_PROVIDER_SITE_OTHER): Payer: Self-pay | Admitting: Neurology

## 2016-09-22 DIAGNOSIS — M79604 Pain in right leg: Secondary | ICD-10-CM

## 2016-09-22 DIAGNOSIS — R413 Other amnesia: Secondary | ICD-10-CM

## 2016-09-22 DIAGNOSIS — M79605 Pain in left leg: Secondary | ICD-10-CM

## 2016-09-22 NOTE — Procedures (Signed)
     HISTORY:  [name] is a 60 year old patient with a history of prior lumbosacral spine surgery and a history of fibromyalgia. The patient has reported increasing discomfort in the thigh areas bilaterally, and in the pretibial areas bilaterally. Numbness is in the legs below the knees. The patient is being evaluated for possible neuropathy or a lumbosacral radiculopathy.  NERVE CONDUCTION STUDIES:  Nerve conduction studies were performed on both lower extremities. The distal motor latencies and motor amplitudes for the peroneal and posterior tibial nerves were within normal limits. The nerve conduction velocities for these nerves were also normal. The H reflex latencies were normal. The sensory latencies for the peroneal nerves were within normal limits.   EMG STUDIES:  EMG study was performed on the right lower extremity:  The tibialis anterior muscle reveals 2 to 5K motor units with decreased recruitment. No fibrillations or positive waves were seen. The peroneus tertius muscle reveals 2 to 5K motor units with slightly decreased recruitment. No fibrillations or positive waves were seen. The medial gastrocnemius muscle reveals 1 to 3K motor units with full recruitment. No fibrillations or positive waves were seen. The vastus lateralis muscle reveals 2 to 4K motor units with full recruitment. No fibrillations or positive waves were seen. The iliopsoas muscle reveals 2 to 4K motor units with full recruitment. No fibrillations or positive waves were seen. The biceps femoris muscle (long head) reveals 2 to 4K motor units with full recruitment. No fibrillations or positive waves were seen. The lumbosacral paraspinal muscles were tested at 3 levels, and revealed no abnormalities of insertional activity at the upper and middle levels tested. 1+ positive waves were seen in the lower level. There was good relaxation.  EMG study was performed on the left lower extremity:  The tibialis  anterior muscle reveals 2 to 4K motor units with full recruitment. No fibrillations or positive waves were seen. The peroneus tertius muscle reveals 2 to 4K motor units with full recruitment. No fibrillations or positive waves were seen. The medial gastrocnemius muscle reveals 1 to 3K motor units with full recruitment. No fibrillations or positive waves were seen. The vastus lateralis muscle reveals 2 to 4K motor units with full recruitment. No fibrillations or positive waves were seen. The iliopsoas muscle reveals 2 to 4K motor units with full recruitment. No fibrillations or positive waves were seen. The biceps femoris muscle (long head) reveals 2 to 4K motor units with full recruitment. No fibrillations or positive waves were seen. The lumbosacral paraspinal muscles were tested at 3 levels, and revealed 1+ positive waves at the upper and middle levels, 2+ positive waves at the lower level. There was good relaxation.   IMPRESSION:  Nerve conduction studies done on both lower extremities were within normal limits. No evidence of a peripheral neuropathy is seen. EMG evaluation of the right lower extremity shows evidence of a chronic stable L5 radiculopathy. EMG of the left lower extremity is unremarkable. Some mild denervation is seen in the lumbosacral paraspinal muscles bilaterally, this may be a residual from the prior lumbosacral spine surgery.  Marlan Palau MD 09/22/2016 1:51 PM  [facility] [address] [place], [place] [address]  Phone [phone] Fax [phone]

## 2016-09-22 NOTE — Progress Notes (Signed)
The patient comes in for EMG and nerve conduction studies today. Nerve conduction studies do not show evidence of a peripheral neuropathy. EMG of the right leg shows a chronic stable L5 radiculopathy, no evidence of a radiculopathy on the left leg.  The patient has had prior lumbosacral spine surgery at the L4-5 level, she claims that the lower extremity symptoms have been gradually progressive with pain in the thighs, numbness and pain in the tibial areas of the lower extremities bilaterally.  The patient will have MRI evaluation of the lumbar spine with and without gadolinium enhancement. She has a revisit in February 2018.

## 2016-09-22 NOTE — Progress Notes (Signed)
Please refer to EMG and nerve conduction study procedure note. 

## 2016-10-23 ENCOUNTER — Ambulatory Visit
Admission: RE | Admit: 2016-10-23 | Discharge: 2016-10-23 | Disposition: A | Discharge status: Home / Self Care | Payer: BLUE CROSS/BLUE SHIELD | Source: Ambulatory Visit | Attending: Neurology | Admitting: Neurology

## 2016-10-23 DIAGNOSIS — M79604 Pain in right leg: Secondary | ICD-10-CM

## 2016-10-23 DIAGNOSIS — M79605 Pain in left leg: Principal | ICD-10-CM

## 2016-10-23 MED ORDER — GADOBENATE DIMEGLUMINE 529 MG/ML IV SOLN
18.0000 mL | Freq: Once | INTRAVENOUS | Status: AC | PRN
  Administered 2016-10-23: 18 mL via INTRAVENOUS

## 2016-10-25 ENCOUNTER — Telehealth: Payer: Self-pay | Admitting: Neurology

## 2016-10-25 DIAGNOSIS — M47817 Spondylosis without myelopathy or radiculopathy, lumbosacral region: Secondary | ICD-10-CM

## 2016-10-25 NOTE — Telephone Encounter (Signed)
    MRI lumbar 10/23/16:  IMPRESSION:  This MRI of the lumbar spine with and without contrast shows multilevel degenerative changes as detailed above. Most significant findings are: 1.   At L1-L2, there is borderline spinal stenosis but there does not appear to be any nerve root compression. 2.   At L2-L3, there are degenerative changes causing moderate spinal stenosis and possible left L3 nerve root compression. 3.   At L3-L4, there are degenerative changes encroaching upon the right L4 nerve root without causing definite compression. 4.   At L4-L5, there is transverse spinal stenosis and probable right L4 and right L5 nerve root compression. 5.   At L5-S1, there has been prior left hemilaminectomy. Appear to be any nerve root compression. There does not appear to be any epidural fibrosis. 6.   There is a normal enhancement pattern.

## 2016-10-25 NOTE — Telephone Encounter (Signed)
I called the patient. The MRI shows some impingement of the right L4 and L5  NR, May consider an epidural injection. She is to call if she is amenable to this. She has had a prior cervical epidural.

## 2016-10-27 NOTE — Addendum Note (Signed)
Addended by: Stephanie Acre on: 10/27/2016 02:33 PM   Modules accepted: Orders

## 2016-10-27 NOTE — Telephone Encounter (Signed)
I will send an order for the lumbosacral epidural steroid injection

## 2016-10-27 NOTE — Telephone Encounter (Signed)
Patient is calling to schedule an appointment for an epidural injection per message from Dr. Anne Hahn.

## 2016-10-28 ENCOUNTER — Other Ambulatory Visit: Payer: Self-pay | Admitting: Neurology

## 2016-10-28 DIAGNOSIS — M47817 Spondylosis without myelopathy or radiculopathy, lumbosacral region: Secondary | ICD-10-CM

## 2016-11-03 ENCOUNTER — Ambulatory Visit
Admission: RE | Admit: 2016-11-03 | Discharge: 2016-11-03 | Disposition: A | Discharge status: Home / Self Care | Payer: BLUE CROSS/BLUE SHIELD | Source: Ambulatory Visit | Attending: Neurology | Admitting: Neurology

## 2016-11-03 DIAGNOSIS — M47817 Spondylosis without myelopathy or radiculopathy, lumbosacral region: Secondary | ICD-10-CM

## 2016-11-03 MED ORDER — METHYLPREDNISOLONE ACETATE 40 MG/ML INJ SUSP (RADIOLOG
120.0000 mg | Freq: Once | INTRAMUSCULAR | Status: AC
  Administered 2016-11-03: 120 mg via EPIDURAL

## 2016-11-03 MED ORDER — IOPAMIDOL (ISOVUE-M 200) INJECTION 41%
1.0000 mL | Freq: Once | INTRAMUSCULAR | Status: AC
  Administered 2016-11-03: 1 mL via EPIDURAL

## 2016-11-03 NOTE — Discharge Instructions (Signed)

## 2016-12-01 ENCOUNTER — Telehealth: Payer: Self-pay | Admitting: Neurology

## 2016-12-01 NOTE — Telephone Encounter (Signed)
Pt called say her lyrica needs PA. Her insurance changed to: [phone]  ID# [id number]  GRP# [id number]   RXBIX # [id number]  GRP# [employer]  Effective 11/23/16  RX's still go to [facility]

## 2016-12-02 MED ORDER — PREGABALIN 100 MG PO CAPS: 100.0000 mg | ORAL_CAPSULE | Freq: Three times a day (TID) | ORAL | 1 refills | Status: DC

## 2016-12-02 NOTE — Addendum Note (Signed)
Addended by: Donnelly Angelica on: 12/02/2016 08:49 AM   Modules accepted: Orders

## 2016-12-02 NOTE — Telephone Encounter (Signed)
Rx printed, signed, faxed to pharmacy. PA initiated through CoverMyMeds.

## 2016-12-03 NOTE — Telephone Encounter (Signed)
[id number] approved until 12/02/2017 per [employer] T # [phone]

## 2016-12-21 NOTE — Telephone Encounter (Signed)
Called [facility] to verify that they received PA approval and new rx that was faxed to them on 12/02/16. Pharmacy did get faxed script and shipped med to pt on 12/11/16. She also has an additional 90 day refill on file. Returned pt's TC. She did receive most recent shipment. Let her know that she also has refill on file. Verbalized understanding and appreciation for call.

## 2016-12-21 NOTE — Telephone Encounter (Signed)
Pt called said she advised by [facility]ist at [facility] she is out of lyrica. Please Jeronimo Greaves can be reached [phone]

## 2017-01-07 ENCOUNTER — Ambulatory Visit (INDEPENDENT_AMBULATORY_CARE_PROVIDER_SITE_OTHER): Payer: 59 | Admitting: Neurology

## 2017-01-07 ENCOUNTER — Encounter: Payer: Self-pay | Admitting: Neurology

## 2017-01-07 VITALS — BP 122/71 | HR 62 | Ht 65.0 in | Wt 187.5 lb

## 2017-01-07 DIAGNOSIS — R413 Other amnesia: Secondary | ICD-10-CM | POA: Diagnosis not present

## 2017-01-07 NOTE — Progress Notes (Signed)
Reason for visit:  Low back pain  [name] is an 61 y.o. female  History of present illness:   [name] a 61 year old right-handed white female with a history of neck and low back pain. The patient has undergone epidural steroid injection of the low back. The patient has gained significant improvement with the low back pain, but she believes that there is decreased functional level with the lower extremities. The patient is unable to lift things and she cannot do a lot of physical activity. The patient is undergoing physical therapy on the neck and shoulders, she is getting dry needling which is helpful. She has not had any falls. She feels weak at the legs at times. She denies any changes in control of the bowels or the bladder. She returns to this office for an evaluation.  Past Medical History:  Diagnosis Date  . Anxiety and depression   . Cervical spondylosis with myelopathy   . Chronic interstitial cystitis   . Diplopia   . Memory deficit 12/27/2013  . Memory disorder   . Memory loss   . Migraine without aura, without mention of intractable migraine without mention of status migrainosus   . Myalgia and myositis, unspecified   . Myalgia and myositis, unspecified   . Other malaise and fatigue   . PTSD (post-traumatic stress disorder)   . Rheumatoid arthritis(714.0)     Past Surgical History:  Procedure Laterality Date  . ABDOMINAL HYSTERECTOMY    . CESAREAN SECTION    . LUMBAR LAMINECTOMY Left 1993    Family History  Problem Relation Age of Onset  . Heart failure Father   . Hypertension Mother     Social history:  reports that she has never smoked. She has never used smokeless tobacco. She reports that she does not drink alcohol or use drugs.    Allergies  Allergen Reactions  . Asa [Aspirin] Shortness Of Breath and Swelling    Wheezing and throat swells  . Ibuprofen Other (See Comments) and Swelling    Wheezing and throat swells Wheezing and throat  swells    Medications:  Prior to Admission medications   Medication Sig Start Date End Date Taking? Authorizing Provider  acetaminophen-codeine (TYLENOL #3) 300-30 MG per tablet Take 1 tablet by mouth every 6 (six) hours as needed for moderate pain. 07/23/15  Yes York Spaniel, MD  azelastine (OPTIVAR) 0.05 % ophthalmic solution Place 0.05 drops into both eyes daily. 06/04/14  Yes Historical Provider, MD  b complex vitamins capsule Take 1 capsule by mouth daily.   Yes Historical Provider, MD  Bioflavonoid Products (C COMPLEX PO) Take 400 mg by mouth daily.   Yes Historical Provider, MD  busPIRone (BUSPAR) 30 MG tablet Take 30 mg by mouth 2 (two) times daily.  05/09/13  Yes Historical Provider, MD  Cholecalciferol (VITAMIN D3) 5000 units CAPS Take 5,000 Units by mouth.   Yes Historical Provider, MD  diphenhydrAMINE (BENADRYL) 12.5 MG chewable tablet Chew 12.5 mg by mouth daily.    Yes Historical Provider, MD  DULoxetine (CYMBALTA) 30 MG capsule Take 30 mg by mouth 3 (three) times daily.  05/08/13  Yes Historical Provider, MD  estradiol (ESTRACE) 0.5 MG tablet Take 1 mg by mouth daily.    Yes Historical Provider, MD  fenofibrate (TRICOR) 48 MG tablet Take 48 mg by mouth daily.  05/15/13  Yes Historical Provider, MD  fexofenadine (ALLEGRA) 180 MG tablet Take 180 mg by mouth daily.   Yes  Historical Provider, MD  folic acid (FOLVITE) 1 MG tablet Take 1 mg by mouth daily.  03/13/13  Yes Historical Provider, MD  Glucosamine-Chondroitin-MSM 500-250-250 MG CAPS Take 3 tablets by mouth daily.    Yes Historical Provider, MD  hydrochlorothiazide (HYDRODIURIL) 25 MG tablet Take 25 mg by mouth daily.  03/24/13  Yes Historical Provider, MD  HYDROcodone-acetaminophen (NORCO/VICODIN) 5-325 MG per tablet Take 1 tablet by mouth every 6 (six) hours as needed for pain.   Yes Historical Provider, MD  hyoscyamine (ANASPAZ) 0.125 MG TBDP Place 0.125 mg under the tongue every 4 (four) hours as needed.   Yes Historical  Provider, MD  Ivermectin (SOOLANTRA) 1 % CREA Apply 1 application topically.   Yes Historical Provider, MD  leflunomide (ARAVA) 10 MG tablet Take 10 mg by mouth daily. 12/29/16  Yes Historical Provider, MD  LORazepam (ATIVAN) 0.5 MG tablet Take 0.5 mg by mouth 3 (three) times daily.  04/24/13  Yes Historical Provider, MD  losartan (COZAAR) 25 MG tablet Take 12.5 mg by mouth daily. 06/11/16  Yes Historical Provider, MD  Magnesium 300 MG CAPS Take 800 mg by mouth daily. Takes two 800 chelated capsules daily per pt   Yes Historical Provider, MD  MALIC ACID PO Take 800 mg by mouth daily.   Yes Historical Provider, MD  methocarbamol (ROBAXIN) 750 MG tablet TAKE 1 BY MOUTH 3 TIMES DAILY 06/16/16  Yes York Spaniel, MD  Multiple Vitamin (MULTIVITAMIN) capsule Take 1 capsule by mouth daily.   Yes Historical Provider, MD  omeprazole (PRILOSEC) 40 MG capsule Take 40 mg by mouth 2 (two) times daily.  05/08/13  Yes Historical Provider, MD  pregabalin (LYRICA) 100 MG capsule Take 1 capsule (100 mg total) by mouth 3 (three) times daily. 12/05/16  Yes York Spaniel, MD  Probiotic Product (PROBIOTIC DAILY) CAPS Take by mouth.   Yes Historical Provider, MD  rizatriptan (MAXALT-MLT) 10 MG disintegrating tablet Take 1 tablet (10 mg total) by mouth as needed for migraine. May repeat in 2 hours if needed 08/20/16  Yes York Spaniel, MD  rosuvastatin (CRESTOR) 10 MG tablet Take 10 mg by mouth daily.   Yes Historical Provider, MD  sertraline (ZOLOFT) 100 MG tablet Take 200 mg by mouth daily.  05/06/13  Yes Historical Provider, MD  Tocilizumab (ACTEMRA IV) Inject into the vein.   Yes Historical Provider, MD  topiramate (TOPAMAX) 50 MG tablet TAKE 1 BY MOUTH EVERY MORNING AND TAKE 2 BY MOUTH AT BEDTIME 06/16/16  Yes York Spaniel, MD    ROS:  Out of a complete 14 system review of symptoms, the patient complains only of the following symptoms, and all other reviewed systems are negative.  Leg weakness  Blood  pressure 122/71, pulse 62, height 5\' 5"  (1.651 m), weight 187 lb 8 oz (85 kg).  Physical Exam  General: The patient is alert and cooperative at the time of the examination. The patient is moderately obese.  Neuromuscular: Range of movement that cervical spine and lumbar spine are full.  Skin: No significant peripheral edema is noted.   Neurologic Exam  Mental status: The patient is alert and oriented x 3 at the time of the examination. The patient has apparent normal recent and remote memory, with an apparently normal attention span and concentration ability.   Cranial nerves: Facial symmetry is present. Speech is normal, no aphasia or dysarthria is noted. Extraocular movements are full. Visual fields are full.  Motor: The patient has good strength  in all 4 extremities.  Sensory examination: Soft touch sensation is symmetric on the face, arms, and legs.  Coordination: The patient has good finger-nose-finger and heel-to-shin bilaterally.  Gait and station: The patient has a normal gait. Tandem gait is normal. Romberg is negative. No drift is seen. The patient is able to walk on heels and the toes bilaterally.  Reflexes: Deep tendon reflexes are symmetric.   MRI lumbar 10/23/16:  IMPRESSION: This MRI of the lumbar spine with and without contrast shows multilevel degenerative changes as detailed above. Most significant findings are: 1. At L1-L2, there is borderline spinal stenosis but there does not appear to be any nerve root compression. 2. At L2-L3, there are degenerative changes causing moderate spinal stenosis and possible left L3 nerve root compression. 3. At L3-L4, there are degenerative changes encroaching upon the right L4 nerve root without causing definite compression. 4. At L4-L5, there is transverse spinal stenosis and probable right L4 and right L5 nerve root compression. 5. At L5-S1, there has been prior left hemilaminectomy. Appear to be any nerve root  compression. There does not appear to be any epidural fibrosis. 6. There is a normal enhancement pattern.   Assessment/Plan:  1. Cervical and lumbar spondylosis  The patient appears to be doing well with the overall level of discomfort. She has good range of movement of the neck and low back, she has good strength, the clinical examination is relatively unremarkable. The patient reports a high level of disability in her ability to perform physical tasks during the day, I suspect her abilities are better than she is indicating. The patient will continue the physical therapy for now, engage in muscle strengthening exercises, she will follow-up in about 9 months.   Marlan Palau MD 01/07/2017 3:51 PM  [facility] [address] [place], [place] [address]  Phone [phone] Fax [phone]

## 2017-02-16 ENCOUNTER — Telehealth: Payer: Self-pay | Admitting: Neurology

## 2017-02-16 MED ORDER — METHOCARBAMOL 750 MG PO TABS: ORAL_TABLET | ORAL | 1 refills | Status: DC

## 2017-02-16 NOTE — Telephone Encounter (Signed)
Patient called office requesting refill for methocarbamol (ROBAXIN) 750 MG tablet.  Pharmacy- [facility]

## 2017-02-16 NOTE — Addendum Note (Signed)
Addended by: York Spaniel on: 02/16/2017 09:05 AM   Modules accepted: Orders

## 2017-02-16 NOTE — Telephone Encounter (Signed)
I will refill the methocarbamol for a 90 day supply. Last refill was given in July 2017.

## 2017-02-17 ENCOUNTER — Other Ambulatory Visit: Payer: Self-pay | Admitting: *Deleted

## 2017-02-17 MED ORDER — TOPIRAMATE 50 MG PO TABS: ORAL_TABLET | ORAL | 1 refills | Status: DC

## 2017-05-07 ENCOUNTER — Other Ambulatory Visit: Payer: Self-pay | Admitting: *Deleted

## 2017-05-07 MED ORDER — PREGABALIN 100 MG PO CAPS: 100.0000 mg | ORAL_CAPSULE | Freq: Three times a day (TID) | ORAL | 1 refills | Status: DC

## 2017-05-07 NOTE — Progress Notes (Signed)
Faxed printed/signed rx lyrica to [facility]. Fax: [phone]. Received confirmation.

## 2017-05-13 ENCOUNTER — Telehealth: Payer: Self-pay | Admitting: *Deleted

## 2017-05-13 NOTE — Telephone Encounter (Signed)
Called and spoke with Trula Ore at [facility]. Advised we received fax request to sent refills for lyrica 100mg  capsule. Advised I faxed refill on 05/07/17 qty 270, 1 refill. She states she can see this and will update their system and get rx ready for patient. Nothing further needed at this time. Disregard fax for refill per 05/09/17.

## 2017-07-29 ENCOUNTER — Other Ambulatory Visit: Payer: Self-pay | Admitting: Neurology

## 2017-10-07 ENCOUNTER — Ambulatory Visit: Payer: 59 | Admitting: Adult Health

## 2017-10-20 ENCOUNTER — Ambulatory Visit: Payer: 59 | Admitting: Adult Health

## 2017-10-20 ENCOUNTER — Encounter: Payer: Self-pay | Admitting: Adult Health

## 2017-10-20 VITALS — BP 127/72 | HR 74 | Wt 189.2 lb

## 2017-10-20 DIAGNOSIS — M797 Fibromyalgia: Secondary | ICD-10-CM | POA: Diagnosis not present

## 2017-10-20 DIAGNOSIS — M47812 Spondylosis without myelopathy or radiculopathy, cervical region: Secondary | ICD-10-CM

## 2017-10-20 MED ORDER — ACETAMINOPHEN-CODEINE #3 300-30 MG PO TABS: 1.0000 | ORAL_TABLET | Freq: Four times a day (QID) | ORAL | 0 refills | Status: DC | PRN

## 2017-10-20 MED ORDER — PREGABALIN 100 MG PO CAPS: 100.0000 mg | ORAL_CAPSULE | Freq: Three times a day (TID) | ORAL | 0 refills | Status: DC

## 2017-10-20 NOTE — Patient Instructions (Signed)
Your Plan:  Continue Lyrica  Tylenol #3 refilled. Should not be taken with other pain medication. If you get Pain medication from another provider please let us know If your symptoms worsen or you develop new symptoms please let us know.   Thank you for coming to see [place] at [facility]. I hope we have been able to provide you high quality care today.  You may receive a patient satisfaction survey over the next few weeks. We would appreciate your feedback and comments so that we may continue to improve ourselves and the health of our patients.

## 2017-10-20 NOTE — Progress Notes (Signed)
Lyrica refill Rx successfully faxed to [facility].

## 2017-10-20 NOTE — Progress Notes (Signed)
I have read the note, and I agree with the clinical assessment and plan.  Areana Kosanke K [place]   

## 2017-10-20 NOTE — Progress Notes (Signed)
PATIENT: SELINA TAPPER DOB: September 05, 1956  REASON FOR VISIT: follow up HISTORY FROM: patient  HISTORY OF PRESENT ILLNESS: Today 10/20/17 Ms. Rigdon is a 61 year old female with a history of neck pain and fibromyalgia.  She returns today for follow-up.  She reports that her neck pain is much better.  She continues to take Robaxin.  She also reports that fibromyalgia is better.  She continues to have some weakness in the legs.  She reports Lyrica offers her benefit.  Her rheumatologist just switched her to Remicade.  She feels that this has been helpful for her fatigue although after infusions she does have more headaches.  She states that she has a severe headache she will use Tylenol No. 3.  She remains on Topamax.  She reports that she has had a prescription of hydrocodone however it was prescribed in 2016 and she has not had any additional refills.  She returns today for an evaluation.  HISTORY Ms. Singleterry a 61 year old right-handed white female with a history of neck and low back pain. The patient has undergone epidural steroid injection of the low back. The patient has gained significant improvement with the low back pain, but she believes that there is decreased functional level with the lower extremities. The patient is unable to lift things and she cannot do a lot of physical activity. The patient is undergoing physical therapy on the neck and shoulders, she is getting dry needling which is helpful. She has not had any falls. She feels weak at the legs at times. She denies any changes in control of the bowels or the bladder. She returns to this office for an evaluation.  REVIEW OF SYSTEMS: Out of a complete 14 system review of symptoms, the patient complains only of the following symptoms, and all other reviewed systems are negative.  Memory loss, headache, numbness, speech difficulty, confusion, decreased concentration, depression, nervous/anxious, joint pain, joint swelling, back  pain  ALLERGIES: Allergies  Allergen Reactions  . Asa [Aspirin] Shortness Of Breath and Swelling    Wheezing and throat swells  . Ibuprofen Other (See Comments) and Swelling    Wheezing and throat swells Wheezing and throat swells    HOME MEDICATIONS: Outpatient Medications Prior to Visit  Medication Sig Dispense Refill  . acetaminophen-codeine (TYLENOL #3) 300-30 MG per tablet Take 1 tablet by mouth every 6 (six) hours as needed for moderate pain. 180 tablet 1  . azelastine (OPTIVAR) 0.05 % ophthalmic solution Place 0.05 drops into both eyes daily.    Marland Kitchen b complex vitamins capsule Take 1 capsule by mouth daily.    Marland Kitchen Bioflavonoid Products (C COMPLEX PO) Take 400 mg by mouth daily.    . busPIRone (BUSPAR) 30 MG tablet Take 30 mg by mouth 2 (two) times daily.     . Cholecalciferol (VITAMIN D3) 5000 units CAPS Take 5,000 Units by mouth.    . diphenhydrAMINE (BENADRYL) 12.5 MG chewable tablet Chew 12.5 mg by mouth daily.     . DULoxetine (CYMBALTA) 30 MG capsule Take 30 mg by mouth 3 (three) times daily.     Marland Kitchen estradiol (ESTRACE) 0.5 MG tablet Take 1 mg by mouth daily.     . fenofibrate 54 MG tablet 54 mg daily.    . fexofenadine (ALLEGRA) 180 MG tablet Take 180 mg by mouth daily.    . folic acid (FOLVITE) 1 MG tablet Take 1 mg by mouth daily.     . Glucosamine-Chondroitin-MSM 500-250-250 MG CAPS Take 3 tablets by  mouth daily.     . hydrochlorothiazide (HYDRODIURIL) 25 MG tablet Take 25 mg by mouth daily.     Marland Kitchen HYDROcodone-acetaminophen (NORCO/VICODIN) 5-325 MG per tablet Take 1 tablet by mouth every 6 (six) hours as needed for pain.    . hyoscyamine (ANASPAZ) 0.125 MG TBDP Place 0.125 mg under the tongue every 4 (four) hours as needed.    . inFLIXimab (REMICADE) 100 MG injection     . Ivermectin (SOOLANTRA) 1 % CREA Apply 1 application topically.    Marland Kitchen leflunomide (ARAVA) 10 MG tablet Take 10 mg by mouth daily.    Marland Kitchen LORazepam (ATIVAN) 0.5 MG tablet Take 0.5 mg by mouth 3 (three) times  daily.     Marland Kitchen losartan (COZAAR) 25 MG tablet Take 12.5 mg by mouth daily.    . Magnesium 300 MG CAPS Take 800 mg by mouth daily. Takes two 800 chelated capsules daily per pt    . MALIC ACID PO Take 800 mg by mouth daily.    . methocarbamol (ROBAXIN) 750 MG tablet TAKE 1 BY MOUTH 3 TIMES DAILY 270 tablet 1  . methotrexate (RHEUMATREX) 2.5 MG tablet 10/20/17 taking 4 tabs once a week    . Multiple Vitamin (MULTIVITAMIN) capsule Take 1 capsule by mouth daily.    Marland Kitchen omeprazole (PRILOSEC) 40 MG capsule Take 40 mg by mouth 2 (two) times daily.     . ORACEA 40 MG capsule Take 40 mg by mouth daily.  1  . potassium chloride (K-DUR,KLOR-CON) 10 MEQ tablet Take 10 mEq by mouth.    . pregabalin (LYRICA) 100 MG capsule Take 1 capsule (100 mg total) by mouth 3 (three) times daily. 270 capsule 1  . Probiotic Product (PROBIOTIC DAILY) CAPS Take by mouth.    . rizatriptan (MAXALT-MLT) 10 MG disintegrating tablet Take 1 tablet (10 mg total) by mouth as needed for migraine. May repeat in 2 hours if needed 27 tablet 3  . rosuvastatin (CRESTOR) 10 MG tablet Take 10 mg by mouth daily.    . sertraline (ZOLOFT) 100 MG tablet Take 200 mg by mouth daily.     Marland Kitchen topiramate (TOPAMAX) 50 MG tablet TAKE 1 BY MOUTH EVERY MORNING AND TAKE 2 BY MOUTH AT BEDTIME 270 tablet 1  . Tocilizumab (ACTEMRA IV) Inject into the vein.    . fenofibrate (TRICOR) 48 MG tablet Take 48 mg by mouth daily.      No facility-administered medications prior to visit.     PAST MEDICAL HISTORY: Past Medical History:  Diagnosis Date  . Anxiety and depression   . Cervical spondylosis with myelopathy   . Chronic interstitial cystitis   . Diplopia   . Memory deficit 12/27/2013  . Memory disorder   . Memory loss   . Migraine without aura, without mention of intractable migraine without mention of status migrainosus   . Myalgia and myositis, unspecified   . Myalgia and myositis, unspecified   . Other malaise and fatigue   . PTSD (post-traumatic  stress disorder)   . Rheumatoid arthritis(714.0)     PAST SURGICAL HISTORY: Past Surgical History:  Procedure Laterality Date  . ABDOMINAL HYSTERECTOMY    . CESAREAN SECTION    . LUMBAR LAMINECTOMY Left 1993    FAMILY HISTORY: Family History  Problem Relation Age of Onset  . Heart failure Father   . Hypertension Mother     SOCIAL HISTORY: Social History   Socioeconomic History  . Marital status: Married    Spouse name: Maisie Fus   .  Number of children: 3  . Years of education: college  . Highest education level: Not on file  Social Needs  . Financial resource strain: Not on file  . Food insecurity - worry: Not on file  . Food insecurity - inability: Not on file  . Transportation needs - medical: Not on file  . Transportation needs - non-medical: Not on file  Occupational History  . Not on file  Tobacco Use  . Smoking status: Never Smoker  . Smokeless tobacco: Never Used  Substance and Sexual Activity  . Alcohol use: No    Alcohol/week: 0.0 oz  . Drug use: No  . Sexual activity: Not on file  Other Topics Concern  . Not on file  Social History Narrative   Patient is married and lives at home with her husband Vinetta Bergamo).[place].   Patient does not work.    Education college.   Right handed.   Caffeine two cups of coffee daily.      PHYSICAL EXAM  Vitals:   10/20/17 1423  BP: 127/72  Pulse: 74  Weight: 189 lb 3.2 oz (85.8 kg)   Body mass index is 31.48 kg/m.  Generalized: Well developed, in no acute distress   Neurological examination  Mentation: Alert oriented to time, place, history taking. Follows all commands speech and language fluent Cranial nerve II-XII: Pupils were equal round reactive to light. Extraocular movements were full, visual field were full on confrontational test. Facial sensation and strength were normal. Uvula tongue midline. Head turning and shoulder shrug  were normal and symmetric. Motor: The motor testing reveals 5 over 5 strength  of all 4 extremities. Good symmetric motor tone is noted throughout.  Sensory: Sensory testing is intact to soft touch on all 4 extremities. No evidence of extinction is noted.  Coordination: Cerebellar testing reveals good finger-nose-finger and heel-to-shin bilaterally.  Gait and station: Gait is normal. Tandem gait is normal. Romberg is negative. No drift is seen.  Reflexes: Deep tendon reflexes are symmetric and normal bilaterally.   DIAGNOSTIC DATA (LABS, IMAGING, TESTING) - I reviewed patient records, labs, notes, testing and imaging myself where available.     ASSESSMENT AND PLAN 61 y.o. year old female  has a past medical history of Anxiety and depression, Cervical spondylosis with myelopathy, Chronic interstitial cystitis, Diplopia, Memory deficit (12/27/2013), Memory disorder, Memory loss, Migraine without aura, without mention of intractable migraine without mention of status migrainosus, Myalgia and myositis, unspecified, Myalgia and myositis, unspecified, Other malaise and fatigue, PTSD (post-traumatic stress disorder), and Rheumatoid arthritis(714.0). here with:  1.  Cervical and lumbar spondylosis 2.  Fibromyalgia  The patient reports overall she is doing well.  Her neck pain has improved.  She also feels that her fibromyalgia is under good control.  I will refill Lyrica today.  I will also give her 15 tablets of Tylenol No. 3 to use for severe headache.  I did advise that she should not take this in conjunction with other pain medication.  Also advised that if she gets prescribed pain medication from another office she should make [place] aware.  She voiced understanding.  I did check the [facility] drug registry and the patient has not received any opioid medication since 2016.  She is advised that if her symptoms worsen or she develops new symptoms she should let us know.  She will follow-up in 8 months or sooner if needed.   Butch Penny, MSN, NP-C 10/20/2017, 2:37 PM [facility] [address]  [place], [place] [address] [phone]

## 2017-10-28 IMAGING — XA Imaging study
2 series · 2 of 2 positions shown · non-contrast
Comparison: none

CLINICAL DATA: Lumbosacral spondylosis without myelopathy. Low back
pain and bilateral lower extremity pain. Moderate multifactorial
spinal stenosis at L2-3 on MRI. Prior laminectomies at L3-4, L4-5,
and L5-S1.

[Series 1: ortho standard · 1 of 1 slices shown (1 of 2)]
[im 1/1]
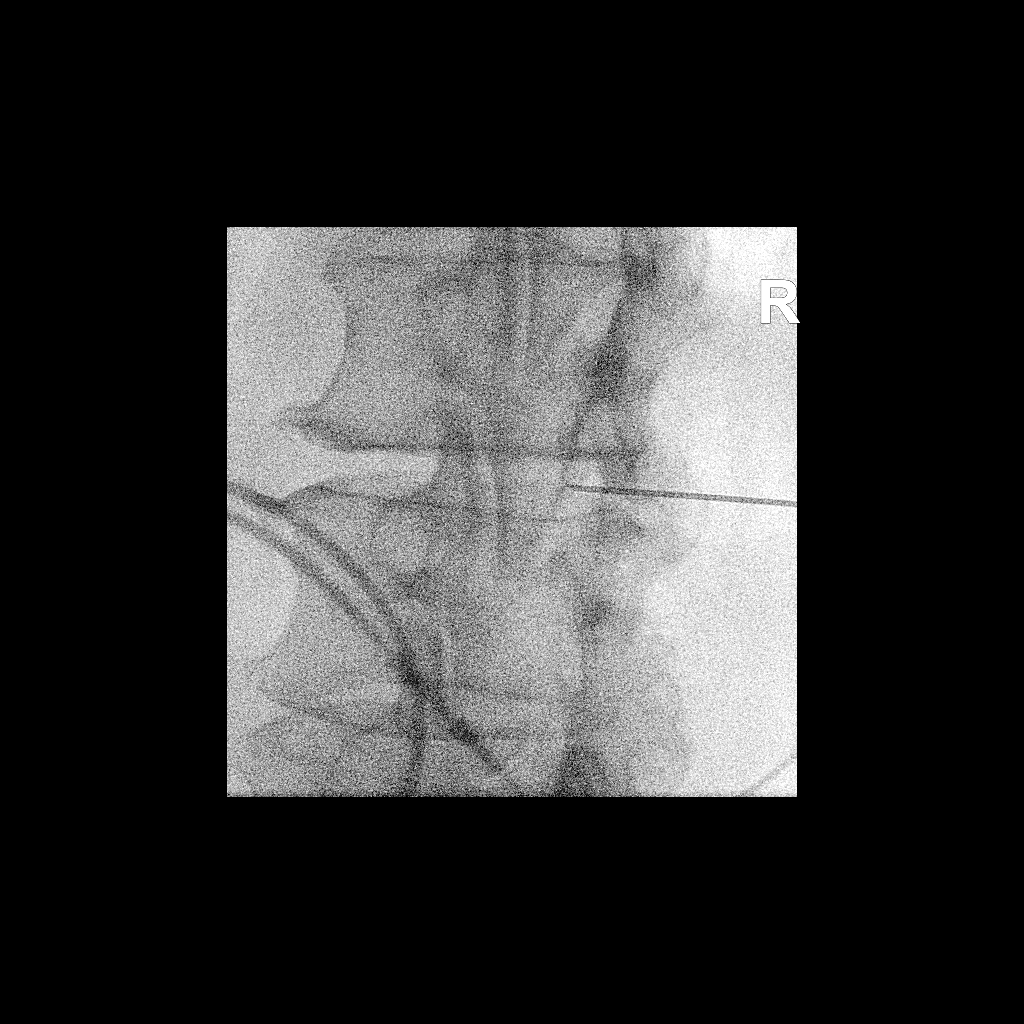

[Series 2: ortho standard · 1 of 1 slices shown (2 of 2)]
[im 1/1]
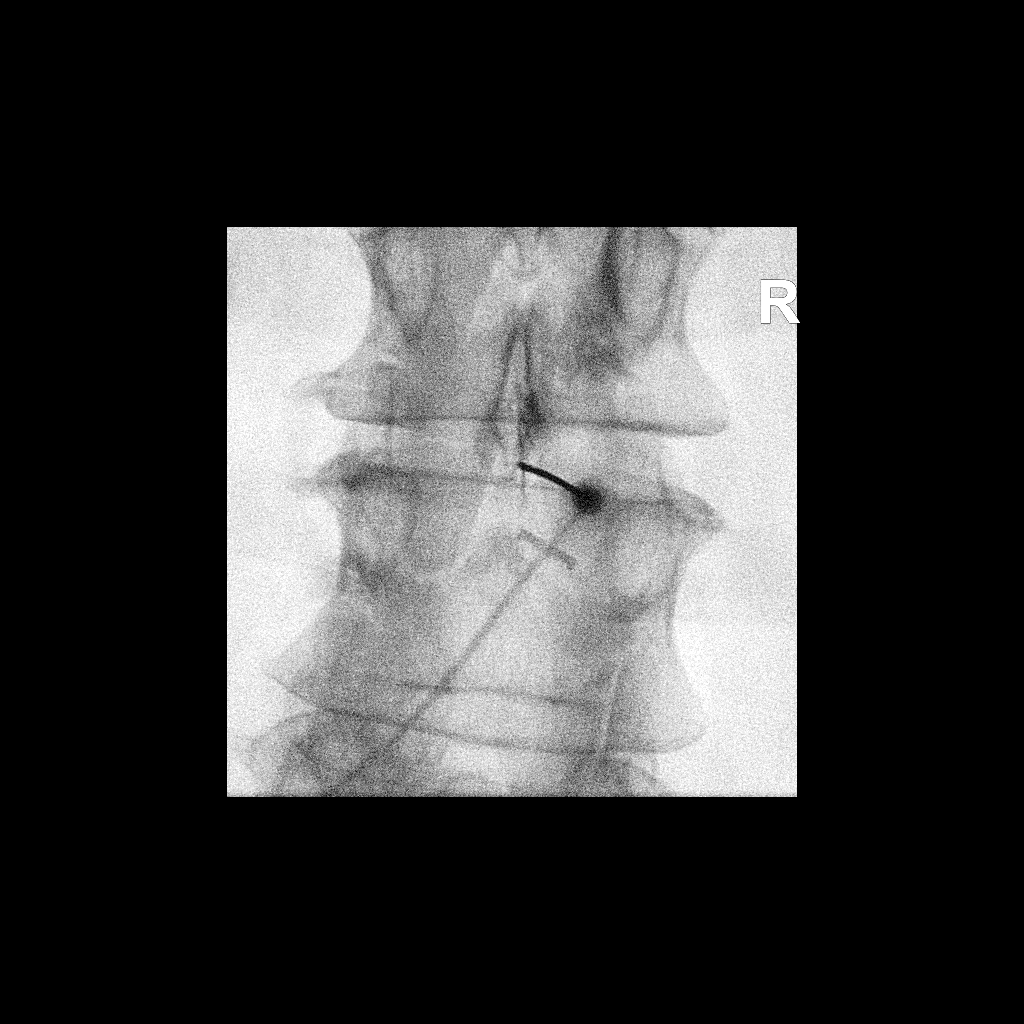

[2 of 2 positions shown; findings below may reference images not displayed]

FLUOROSCOPY TIME:  Radiation Exposure Index (as provided by the
fluoroscopic device): 15.12 microGray*m^2

Fluoroscopy Time (in minutes and seconds):  9 seconds

PROCEDURE:
The procedure, risks, benefits, and alternatives were explained to
the patient. Questions regarding the procedure were encouraged and
answered. The patient understands and consents to the procedure.

LUMBAR EPIDURAL INJECTION:

An interlaminar approach was performed on the right at L2-3. The
overlying skin was cleansed and anesthetized. A 3.5 inch 20 gauge
epidural needle was advanced using loss-of-resistance technique.

DIAGNOSTIC EPIDURAL INJECTION:

Injection of Isovue-M 200 shows a good epidural pattern with spread
above and below the level of needle placement, bilaterally but
greater on the right. No vascular opacification is seen.

THERAPEUTIC EPIDURAL INJECTION:

120 mg of Depo-Medrol mixed with 3 mL of 1% lidocaine were
instilled. The procedure was well-tolerated, and the patient was
discharged thirty minutes following the injection in good condition.

COMPLICATIONS:
None
IMPRESSION: Technically successful lumbar interlaminar epidural injection on the
right at L2-3.

## 2017-12-27 ENCOUNTER — Other Ambulatory Visit: Payer: Self-pay

## 2017-12-27 MED ORDER — PREGABALIN 100 MG PO CAPS: 100.0000 mg | ORAL_CAPSULE | Freq: Three times a day (TID) | ORAL | 1 refills | Status: DC

## 2017-12-27 NOTE — Telephone Encounter (Signed)
A prescription for Lyrica will be given.

## 2017-12-27 NOTE — Telephone Encounter (Signed)
Ok for refill on her Lyrica?

## 2017-12-27 NOTE — Addendum Note (Signed)
Addended by: York Spaniel on: 12/27/2017 11:53 AM   Modules accepted: Orders

## 2017-12-27 NOTE — Telephone Encounter (Signed)
Prescription done for 6 months.  Has appt in 06/2018.

## 2017-12-27 NOTE — Telephone Encounter (Signed)
Faxed printed/signed rx Lyrica 100mg  capsule to [facility] at [phone]. Received fax confirmation.

## 2017-12-28 NOTE — Telephone Encounter (Signed)
refaxed with confirmation

## 2018-01-25 ENCOUNTER — Other Ambulatory Visit: Payer: Self-pay | Admitting: Neurology

## 2018-02-24 ENCOUNTER — Other Ambulatory Visit: Payer: Self-pay | Admitting: *Deleted

## 2018-02-24 MED ORDER — TOPIRAMATE 50 MG PO TABS: ORAL_TABLET | ORAL | 1 refills | Status: DC

## 2018-05-02 ENCOUNTER — Other Ambulatory Visit: Payer: Self-pay | Admitting: Adult Health

## 2018-05-05 ENCOUNTER — Telehealth: Payer: Self-pay | Admitting: *Deleted

## 2018-05-05 MED ORDER — ACETAMINOPHEN-CODEINE #3 300-30 MG PO TABS: 1.0000 | ORAL_TABLET | Freq: Four times a day (QID) | ORAL | 0 refills | Status: DC | PRN

## 2018-05-05 NOTE — Telephone Encounter (Signed)
Received refill request from [facility] for acetaminophen/codeine #3.  Last prescription provided at office visit with [facility] on 10/20/17 for #15 x 0.  [place] narcotic registry checked without any issues noted.  She has a pending appt on 08/09/18.

## 2018-06-06 ENCOUNTER — Telehealth: Payer: Self-pay | Admitting: *Deleted

## 2018-06-06 ENCOUNTER — Other Ambulatory Visit: Payer: Self-pay | Admitting: *Deleted

## 2018-06-06 MED ORDER — PREGABALIN 100 MG PO CAPS
100.0000 mg | ORAL_CAPSULE | Freq: Three times a day (TID) | ORAL | 0 refills | Status: DC
Start: 2018-06-06 — End: 2018-08-23

## 2018-06-06 NOTE — Telephone Encounter (Signed)
Lyrica refill Rx successfully faxed to [facility].

## 2018-06-20 ENCOUNTER — Ambulatory Visit: Payer: 59 | Admitting: Adult Health

## 2018-07-24 ENCOUNTER — Other Ambulatory Visit: Payer: Self-pay | Admitting: Adult Health

## 2018-07-26 NOTE — Telephone Encounter (Signed)
Refills for Methocarbamol and Topamax submitted to [facility]. Patient has an appointment scheduled for 07/30/2018. MB RN.

## 2018-08-09 ENCOUNTER — Ambulatory Visit: Payer: 59 | Admitting: Adult Health

## 2018-08-09 ENCOUNTER — Encounter: Payer: Self-pay | Admitting: Adult Health

## 2018-08-09 VITALS — BP 128/64 | HR 72 | Ht 65.0 in | Wt 185.8 lb

## 2018-08-09 DIAGNOSIS — M797 Fibromyalgia: Secondary | ICD-10-CM | POA: Diagnosis not present

## 2018-08-09 DIAGNOSIS — M47812 Spondylosis without myelopathy or radiculopathy, cervical region: Secondary | ICD-10-CM

## 2018-08-09 MED ORDER — ACETAMINOPHEN-CODEINE #3 300-30 MG PO TABS: 1.0000 | ORAL_TABLET | Freq: Four times a day (QID) | ORAL | 0 refills | Status: DC | PRN

## 2018-08-09 NOTE — Patient Instructions (Signed)
Your Plan:  Continue Topamax Continue Lyrica  Tylenol #3 ordered If your symptoms worsen or you develop new symptoms please let us know.   Thank you for coming to see [place] at [facility]. I hope we have been able to provide you high quality care today.  You may receive a patient satisfaction survey over the next few weeks. We would appreciate your feedback and comments so that we may continue to improve ourselves and the health of our patients.

## 2018-08-09 NOTE — Progress Notes (Signed)
PATIENT: [name] DOB: 08/02/1956  REASON FOR VISIT: follow up HISTORY FROM: patient  HISTORY OF PRESENT ILLNESS: Today 08/09/18:  [name] is a 62 year old female with a history of neck pain, headaches and fibromyalgia.  She returns today for follow-up.  She reports that Lyrica continues to work well for Deborah fibromyalgia.  She states that Deborah headaches have gotten slightly worse but she contributes this to a change in Deborah vision.  She reports that she needs new glasses is in the process of getting those.  She states that she typically will use Tylenol 3 for severe headaches she has not had a refill in several months.  Continues on Topamax.  She reports that she did have a fall last Friday.  Reports that she tripped over Deborah dog and has an abrasion on the right elbow.  She returns today for evaluation.  HISTORY 10/20/17 [name] is a 62 year old female with a history of neck pain and fibromyalgia.  She returns today for follow-up.  She reports that Deborah neck pain is much better.  She continues to take Robaxin.  She also reports that fibromyalgia is better.  She continues to have some weakness in the legs.  She reports Lyrica offers Deborah benefit.  Deborah rheumatologist just switched Deborah to Remicade.  She feels that this has been helpful for Deborah fatigue although after infusions she does have more headaches.  She states that she has a severe headache she will use Tylenol No. 3.  She remains on Topamax.  She reports that she has had a prescription of hydrocodone however it was prescribed in 2016 and she has not had any additional refills.  She returns today for an evaluation.   REVIEW OF SYSTEMS: Out of a complete 14 system review of symptoms, the patient complains only of the following symptoms, and all other reviewed systems are negative.  See HPI  ALLERGIES: Allergies  Allergen Reactions  . Asa [Aspirin] Shortness Of Breath and Swelling    Wheezing and throat swells  . Ibuprofen  Other (See Comments) and Swelling    Wheezing and throat swells Wheezing and throat swells    HOME MEDICATIONS: Outpatient Medications Prior to Visit  Medication Sig Dispense Refill  . azelastine (OPTIVAR) 0.05 % ophthalmic solution Place 0.05 drops into both eyes daily.    Marland Kitchen b complex vitamins capsule Take 1 capsule by mouth daily.    Marland Kitchen Bioflavonoid Products (C COMPLEX PO) Take 400 mg by mouth daily.    . busPIRone (BUSPAR) 30 MG tablet Take 30 mg by mouth 2 (two) times daily.     . Cholecalciferol (VITAMIN D3) 5000 units CAPS Take 5,000 Units by mouth.    . diphenhydrAMINE (BENADRYL) 12.5 MG chewable tablet Chew 12.5 mg by mouth daily.     . DULoxetine (CYMBALTA) 30 MG capsule Take 30 mg by mouth 3 (three) times daily.     Marland Kitchen estradiol (ESTRACE) 0.5 MG tablet Take 1 mg by mouth daily.     . fenofibrate 54 MG tablet 54 mg daily.    . fexofenadine (ALLEGRA) 180 MG tablet Take 180 mg by mouth daily.    . folic acid (FOLVITE) 1 MG tablet Take 1 mg by mouth daily.     . Glucosamine-Chondroitin-MSM 500-250-250 MG CAPS Take 3 tablets by mouth daily.     . hydrochlorothiazide (HYDRODIURIL) 25 MG tablet Take 25 mg by mouth daily.     . hyoscyamine (ANASPAZ) 0.125 MG TBDP Place 0.125 mg  under the tongue every 4 (four) hours as needed.    . inFLIXimab (REMICADE) 100 MG injection     . Ivermectin (SOOLANTRA) 1 % CREA Apply 1 application topically.    Marland Kitchen LORazepam (ATIVAN) 0.5 MG tablet Take 0.5 mg by mouth 3 (three) times daily.     Marland Kitchen losartan (COZAAR) 25 MG tablet Take 12.5 mg by mouth daily.    . Magnesium 300 MG CAPS Take 800 mg by mouth daily. Takes two 800 chelated capsules daily per pt    . MALIC ACID PO Take 800 mg by mouth daily.    . methocarbamol (ROBAXIN) 750 MG tablet TAKE 1 BY MOUTH 3 TIMES DAILY 270 tablet 1  . methotrexate (RHEUMATREX) 2.5 MG tablet 10/20/17 taking 4 tabs once a week    . Multiple Vitamin (MULTIVITAMIN) capsule Take 1 capsule by mouth daily.    Marland Kitchen omeprazole  (PRILOSEC) 40 MG capsule Take 40 mg by mouth 2 (two) times daily.     . ORACEA 40 MG capsule Take 40 mg by mouth daily.  1  . potassium chloride (K-DUR,KLOR-CON) 10 MEQ tablet Take 10 mEq by mouth.    . pregabalin (LYRICA) 100 MG capsule Take 1 capsule (100 mg total) by mouth 3 (three) times daily. 270 capsule 0  . Probiotic Product (PROBIOTIC DAILY) CAPS Take by mouth.    . rizatriptan (MAXALT-MLT) 10 MG disintegrating tablet Take 1 tablet (10 mg total) by mouth as needed for migraine. May repeat in 2 hours if needed 27 tablet 3  . rosuvastatin (CRESTOR) 10 MG tablet Take 10 mg by mouth daily.    . sertraline (ZOLOFT) 100 MG tablet Take 200 mg by mouth daily.     Marland Kitchen topiramate (TOPAMAX) 50 MG tablet TAKE 1 BY MOUTH EVERY MORNING AND TAKE 2 BY MOUTH AT BEDTIME 270 tablet 1  . UNABLE TO FIND Med Name: Wonda Cheng    . UNABLE TO FIND Med Name: Candida complete    . leflunomide (ARAVA) 10 MG tablet Take 10 mg by mouth daily.    . Tocilizumab (ACTEMRA IV) Inject into the vein.    Marland Kitchen acetaminophen-codeine (TYLENOL #3) 300-30 MG tablet Take 1 tablet by mouth every 6 (six) hours as needed for moderate pain. (Patient not taking: Reported on 08/09/2018) 15 tablet 0  . HYDROcodone-acetaminophen (NORCO/VICODIN) 5-325 MG per tablet Take 1 tablet by mouth every 6 (six) hours as needed for pain.     No facility-administered medications prior to visit.     PAST MEDICAL HISTORY: Past Medical History:  Diagnosis Date  . Anxiety and depression   . Cervical spondylosis with myelopathy   . Chronic interstitial cystitis   . Diplopia   . Memory deficit 12/27/2013  . Memory disorder   . Memory loss   . Migraine without aura, without mention of intractable migraine without mention of status migrainosus   . Myalgia and myositis, unspecified   . Myalgia and myositis, unspecified   . Other malaise and fatigue   . PTSD (post-traumatic stress disorder)   . Rheumatoid arthritis(714.0)     PAST SURGICAL  HISTORY: Past Surgical History:  Procedure Laterality Date  . ABDOMINAL HYSTERECTOMY    . CESAREAN SECTION    . LUMBAR LAMINECTOMY Left 1993    FAMILY HISTORY: Family History  Problem Relation Age of Onset  . Heart failure Father   . Hypertension Mother     SOCIAL HISTORY: Social History   Socioeconomic History  . Marital status: Married  Spouse name: Maisie Fus   . Number of children: 3  . Years of education: college  . Highest education level: Not on file  Occupational History  . Not on file  Social Needs  . Financial resource strain: Not on file  . Food insecurity:    Worry: Not on file    Inability: Not on file  . Transportation needs:    Medical: Not on file    Non-medical: Not on file  Tobacco Use  . Smoking status: Never Smoker  . Smokeless tobacco: Never Used  Substance and Sexual Activity  . Alcohol use: No    Alcohol/week: 0.0 standard drinks  . Drug use: No  . Sexual activity: Not on file  Lifestyle  . Physical activity:    Days per week: Not on file    Minutes per session: Not on file  . Stress: Not on file  Relationships  . Social connections:    Talks on phone: Not on file    Gets together: Not on file    Attends religious service: Not on file    Active member of club or organization: Not on file    Attends meetings of clubs or organizations: Not on file    Relationship status: Not on file  . Intimate partner violence:    Fear of current or ex partner: Not on file    Emotionally abused: Not on file    Physically abused: Not on file    Forced sexual activity: Not on file  Other Topics Concern  . Not on file  Social History Narrative   Patient is married and lives at home with Deborah husband Vinetta Bergamo).Thomas.   Patient does not work.    Education college.   Right handed.   Caffeine two cups of coffee daily.      PHYSICAL EXAM  Vitals:   08/09/18 1350  BP: 128/64  Pulse: 72  Weight: 185 lb 12.8 oz (84.3 kg)  Height: 5\' 5"  (1.651 m)    Body mass index is 30.92 kg/m.  Generalized: Well developed, in no acute distress   Neurological examination  Mentation: Alert oriented to time, place, history taking. Follows all commands speech and language fluent Cranial nerve II-XII: Pupils were equal round reactive to light. Extraocular movements were full, visual field were full on confrontational test. Facial sensation and strength were normal. Uvula tongue midline. Head turning and shoulder shrug  were normal and symmetric. Motor: The motor testing reveals 5 over 5 strength of all 4 extremities. Good symmetric motor tone is noted throughout.  Sensory: Sensory testing is intact to soft touch on all 4 extremities. No evidence of extinction is noted.  Coordination: Cerebellar testing reveals good finger-nose-finger and heel-to-shin bilaterally.  Gait and station: Gait is normal.  Tandem gait is unsteady.  Romberg is negative Reflexes: Deep tendon reflexes are symmetric and normal bilaterally.   DIAGNOSTIC DATA (LABS, IMAGING, TESTING) - I reviewed patient records, labs, notes, testing and imaging myself where available.     ASSESSMENT AND PLAN 62 y.o. year old female  has a past medical history of Anxiety and depression, Cervical spondylosis with myelopathy, Chronic interstitial cystitis, Diplopia, Memory deficit (12/27/2013), Memory disorder, Memory loss, Migraine without aura, without mention of intractable migraine without mention of status migrainosus, Myalgia and myositis, unspecified, Myalgia and myositis, unspecified, Other malaise and fatigue, PTSD (post-traumatic stress disorder), and Rheumatoid arthritis(714.0). here with:  1.  Fibromyalgia 2.  Migraine headaches 3.  Cervical spondylosis   The patient will  continue on Lyrica for fibromyalgia.  She will remain on Topamax.  I will refill Tylenol 3 today to use for severe headaches.  She is advised that if Deborah headaches do not improve after she changes Deborah prescription for  eyeglasses she should let us know.  She will follow-up in 6 months or sooner if needed.   Butch Penny, MSN, NP-C 08/09/2018, 5:04 PM [facility] [address], [address] [place], [place] [address] [phone]

## 2018-08-09 NOTE — Progress Notes (Signed)
I have read the note, and I agree with the clinical assessment and plan.  Charles K Willis   

## 2018-08-10 ENCOUNTER — Telehealth: Payer: Self-pay | Admitting: Adult Health

## 2018-08-10 MED ORDER — ACETAMINOPHEN-CODEINE #3 300-30 MG PO TABS: 1.0000 | ORAL_TABLET | Freq: Four times a day (QID) | ORAL | 0 refills | Status: DC | PRN

## 2018-08-10 NOTE — Telephone Encounter (Signed)
Called [facility], spoke with Misty Stanley and advised she discontinue Rx for Tylenol #3 per NP's instructions. Informed her the patient requested it be sent to her local pharmacy.  She verbalized understanding, stated she had taken care of it.

## 2018-08-10 NOTE — Telephone Encounter (Signed)
Refill sent to [facility]. Please make sure prescription with [facility] is canceled.

## 2018-08-10 NOTE — Telephone Encounter (Signed)
Pt requesting rx for acetaminophen-codeine (TYLENOL #3) 300-30 MG tablet go through [facility]. Requesting a call once this is done

## 2018-08-10 NOTE — Telephone Encounter (Signed)
Spoke with patient and informed her that NP refilled Tylenol #3 yesterday to [facility]. The patient stated she had requested it be sent to [facility] because she will not get it from [facility] until 08/30/18.  She stated she needs to pick it up locally. This RN advised will let NP know. She verbalized understanding, appreciation.

## 2018-08-10 NOTE — Addendum Note (Signed)
Addended by: Enedina Finner on: 08/10/2018 04:22 PM   Modules accepted: Orders

## 2018-08-13 ENCOUNTER — Encounter: Payer: Self-pay | Admitting: Adult Health

## 2018-08-15 ENCOUNTER — Telehealth: Payer: Self-pay | Admitting: *Deleted

## 2018-08-15 NOTE — Telephone Encounter (Signed)
My Chart message received:  I received this email (below) from [facility] Thursday, the 17th. As of the evening of the 21st, [facility] has no record of receiving a prescription written by you to be filled for me.    Meaghan, would you mind making one more effort to fill this prescription at [facility] (@ [address] of [address]) in [place]? They don't give out their fax number; they ask the provider to call them and request the number . Phone number: [phone])  Called [facility] and was told the prescription was sent to the [facility]. She stated it cannot be transferred because it is a controlled drug. She stated the Rx is ready for patient to pick up at the other [facility] [address]. If patient wants to pick it up at her location, the other [facility] must be called to cancel Rx. Then a new Rx must be sent to [place].  Called patient and LVM advising her of above situation and Rx ready to be picked up at [facility].  Requested she call back if she wants it resent to [employer].

## 2018-08-23 ENCOUNTER — Other Ambulatory Visit: Payer: Self-pay | Admitting: Adult Health

## 2018-08-23 NOTE — Telephone Encounter (Signed)
Drug Registry checked. Last prescribed 06-06-18 for 270 tabs (1 po TID).  06-25-18 #90, 07-23-18 # 90. I think as one more refill of 90, prior to being reordered.   Spoke with Mal Amabile with [facility] and they reach out for [place] when one refill left as will need to process order. (that takes sometime).

## 2018-08-24 NOTE — Telephone Encounter (Signed)
Fax confirmation received for lyrica [facility] [phone]. 08/24/18 sy

## 2018-11-21 ENCOUNTER — Telehealth: Payer: Self-pay

## 2018-11-21 ENCOUNTER — Other Ambulatory Visit: Payer: Self-pay | Admitting: Neurology

## 2018-11-21 NOTE — Telephone Encounter (Signed)
New script for Lyrica has been signed and faxed back to [phone]. Confirmation fax has been received.

## 2018-11-21 NOTE — Telephone Encounter (Signed)
Opened in error

## 2018-11-22 HISTORY — PX: OTHER SURGICAL HISTORY: SHX169

## 2019-01-20 ENCOUNTER — Other Ambulatory Visit: Payer: Self-pay | Admitting: Adult Health

## 2019-04-18 ENCOUNTER — Other Ambulatory Visit: Payer: Self-pay | Admitting: Adult Health

## 2019-04-18 NOTE — Telephone Encounter (Signed)
Drug registry checked.  Last fill 04-15-19 # 90 lyrica 100mg  po TID.

## 2019-04-19 NOTE — Telephone Encounter (Signed)
Can wait till its closer to due date

## 2019-04-19 NOTE — Telephone Encounter (Signed)
This was last refilled 04/15/19? Why does she need a new prescription?

## 2019-04-19 NOTE — Telephone Encounter (Signed)
Pt gets from [facility] (mail order).  I called them yesterday, lyrica controlled and prescription only good for 6 months. I called spoke to [facility], again and after looking again, he stated there was another refill available, then after that need new prescription.  FYI.   Do you want to wait then? Or place note start after 05/23/19.

## 2019-05-01 ENCOUNTER — Telehealth: Payer: Self-pay | Admitting: *Deleted

## 2019-05-01 NOTE — Telephone Encounter (Signed)
Due to current COVID 19 pandemic, our office is severely reducing in office visits until further notice, in order to minimize the risk to our patients and healthcare providers.  Pt understands that although there may be some limitations with this type of visit, we will take all precautions to reduce any security or privacy concerns.  Pt understands that this will be treated like an in office visit and we will file with pt's insurance, and there may be a patient responsible charge related to this service. Pt consented to [email or web address] visit.  email sent to [email or web address]

## 2019-05-03 ENCOUNTER — Other Ambulatory Visit: Payer: Self-pay

## 2019-05-03 ENCOUNTER — Encounter: Payer: Self-pay | Admitting: Adult Health

## 2019-05-03 ENCOUNTER — Ambulatory Visit (INDEPENDENT_AMBULATORY_CARE_PROVIDER_SITE_OTHER): Payer: BC Managed Care – PPO | Admitting: Adult Health

## 2019-05-03 DIAGNOSIS — M797 Fibromyalgia: Secondary | ICD-10-CM

## 2019-05-03 DIAGNOSIS — G43009 Migraine without aura, not intractable, without status migrainosus: Secondary | ICD-10-CM

## 2019-05-03 DIAGNOSIS — M47812 Spondylosis without myelopathy or radiculopathy, cervical region: Secondary | ICD-10-CM

## 2019-05-03 MED ORDER — RIZATRIPTAN BENZOATE 10 MG PO TBDP: 10.0000 mg | ORAL_TABLET | ORAL | 3 refills | Status: DC | PRN

## 2019-05-03 MED ORDER — PREGABALIN 100 MG PO CAPS: 100.0000 mg | ORAL_CAPSULE | Freq: Three times a day (TID) | ORAL | 0 refills | Status: DC

## 2019-05-03 MED ORDER — ACETAMINOPHEN-CODEINE #3 300-30 MG PO TABS: 1.0000 | ORAL_TABLET | Freq: Four times a day (QID) | ORAL | 0 refills | Status: DC | PRN

## 2019-05-03 NOTE — Progress Notes (Signed)
PATIENT: [name] DOB: [birth year]  REASON FOR VISIT: follow up HISTORY FROM: patient  Virtual Visit via Video Note  I connected with [name] on 05/03/19 at  2:00 PM EDT by a video enabled telemedicine application located remotely at [facility] and verified that I am speaking with the correct person using two identifiers who was located at their own home.   I discussed the limitations of evaluation and management by telemedicine and the availability of in person appointments. The patient expressed understanding and agreed to proceed.   PATIENT: [name] DOB: July 28, 1956  REASON FOR VISIT: follow up HISTORY FROM: patient  HISTORY OF PRESENT ILLNESS: Today 05/03/19: [name] is a 63 year old Female with a history of neck pain, headaches and fibromyalgia.  She returns today for follow-up.  She reports that Lyrica continues to be beneficial for her fibromyalgia.  She continues on Topamax for headaches.  She reports that she had 3-4 mild headaches per week that typically responds with Tylenol.  She states that she rarely has a migraine but when she does she either takes Rizitriptan or Tylenol 3 with good benefit.  She states that she also has trouble sleeping and that may be the cause of some of her mild headaches.  She joins me today for a virtual visit.  HISTORY 08/09/18:  [name] is a 63 year old female with a history of neck pain, headaches and fibromyalgia.  She returns today for follow-up.  She reports that Lyrica continues to work well for her fibromyalgia.  She states that her headaches have gotten slightly worse but she contributes this to a change in her vision.  She reports that she needs new glasses is in the process of getting those.  She states that she typically will use Tylenol 3 for severe headaches she has not had a refill in several months.  Continues on Topamax.  She reports that she did have a fall last Friday.   Reports that she tripped over her dog and has an abrasion on the right elbow.  She returns today for evaluation.  REVIEW OF SYSTEMS: Out of a complete 14 system review of symptoms, the patient complains only of the following symptoms, and all other reviewed systems are negative.  See HPI  ALLERGIES: Allergies  Allergen Reactions  . Asa [Aspirin] Shortness Of Breath and Swelling    Wheezing and throat swells  . Ibuprofen Other (See Comments) and Swelling    Wheezing and throat swells Wheezing and throat swells    HOME MEDICATIONS: Outpatient Medications Prior to Visit  Medication Sig Dispense Refill  . acetaminophen-codeine (TYLENOL #3) 300-30 MG tablet Take 1 tablet by mouth every 6 (six) hours as needed for moderate pain. 15 tablet 0  . azelastine (OPTIVAR) 0.05 % ophthalmic solution Place 0.05 drops into both eyes daily.    Marland Kitchen b complex vitamins capsule Take 1 capsule by mouth daily.    Marland Kitchen Bioflavonoid Products (C COMPLEX PO) Take 400 mg by mouth daily.    . busPIRone (BUSPAR) 30 MG tablet Take 30 mg by mouth 2 (two) times daily.     . Cholecalciferol (VITAMIN D3) 5000 units CAPS Take 5,000 Units by mouth.    . diphenhydrAMINE (BENADRYL) 12.5 MG chewable tablet Chew 12.5 mg by mouth daily.     . DULoxetine (CYMBALTA) 30 MG capsule Take 30 mg by mouth 3 (three) times daily.     Marland Kitchen estradiol (ESTRACE) 0.5 MG tablet Take 1 mg  by mouth daily.     . fenofibrate 54 MG tablet 54 mg daily.    . fexofenadine (ALLEGRA) 180 MG tablet Take 180 mg by mouth daily.    . folic acid (FOLVITE) 1 MG tablet Take 1 mg by mouth daily.     . Glucosamine-Chondroitin-MSM 500-250-250 MG CAPS Take 3 tablets by mouth daily.     . hydrochlorothiazide (HYDRODIURIL) 25 MG tablet Take 25 mg by mouth daily.     . hyoscyamine (ANASPAZ) 0.125 MG TBDP Place 0.125 mg under the tongue every 4 (four) hours as needed.    . inFLIXimab (REMICADE) 100 MG injection     . Ivermectin (SOOLANTRA) 1 % CREA Apply 1 application  topically.    Marland Kitchen leflunomide (ARAVA) 10 MG tablet Take 10 mg by mouth daily.    Marland Kitchen LORazepam (ATIVAN) 0.5 MG tablet Take 0.5 mg by mouth 3 (three) times daily.     Marland Kitchen losartan (COZAAR) 25 MG tablet Take 12.5 mg by mouth daily.    . Magnesium 300 MG CAPS Take 800 mg by mouth daily. Takes two 800 chelated capsules daily per pt    . MALIC ACID PO Take 800 mg by mouth daily.    . methocarbamol (ROBAXIN) 750 MG tablet TAKE 1 BY MOUTH 3 TIMES DAILY 270 tablet 1  . methotrexate (RHEUMATREX) 2.5 MG tablet 10/20/17 taking 4 tabs once a week    . Multiple Vitamin (MULTIVITAMIN) capsule Take 1 capsule by mouth daily.    Marland Kitchen omeprazole (PRILOSEC) 40 MG capsule Take 40 mg by mouth 2 (two) times daily.     . ORACEA 40 MG capsule Take 40 mg by mouth daily.  1  . potassium chloride (K-DUR,KLOR-CON) 10 MEQ tablet Take 10 mEq by mouth.    . pregabalin (LYRICA) 100 MG capsule Take 1 capsule by mouth three times daily. 270 capsule 2  . Probiotic Product (PROBIOTIC DAILY) CAPS Take by mouth.    . rizatriptan (MAXALT-MLT) 10 MG disintegrating tablet Take 1 tablet (10 mg total) by mouth as needed for migraine. May repeat in 2 hours if needed 27 tablet 3  . rosuvastatin (CRESTOR) 10 MG tablet Take 10 mg by mouth daily.    . sertraline (ZOLOFT) 100 MG tablet Take 200 mg by mouth daily.     . Tocilizumab (ACTEMRA IV) Inject into the vein.    Marland Kitchen topiramate (TOPAMAX) 50 MG tablet TAKE 1 BY MOUTH EVERY MORNING AND TAKE 2 BY MOUTH AT BEDTIME 270 tablet 0  . UNABLE TO FIND Med Name: Wonda Cheng    . UNABLE TO FIND Med Name: Candida complete     No facility-administered medications prior to visit.     PAST MEDICAL HISTORY: Past Medical History:  Diagnosis Date  . Anxiety and depression   . Cervical spondylosis with myelopathy   . Chronic interstitial cystitis   . Diplopia   . Memory deficit 12/27/2013  . Memory disorder   . Memory loss   . Migraine without aura, without mention of intractable migraine without  mention of status migrainosus   . Myalgia and myositis, unspecified   . Myalgia and myositis, unspecified   . Other malaise and fatigue   . PTSD (post-traumatic stress disorder)   . Rheumatoid arthritis(714.0)     PAST SURGICAL HISTORY: Past Surgical History:  Procedure Laterality Date  . ABDOMINAL HYSTERECTOMY    . CESAREAN SECTION    . LUMBAR LAMINECTOMY Left 1993    FAMILY HISTORY: Family History  Problem  Relation Age of Onset  . Heart failure Father   . Hypertension Mother     SOCIAL HISTORY: Social History   Socioeconomic History  . Marital status: Married    Spouse name: Maisie Fus   . Number of children: 3  . Years of education: college  . Highest education level: Not on file  Occupational History  . Not on file  Social Needs  . Financial resource strain: Not on file  . Food insecurity:    Worry: Not on file    Inability: Not on file  . Transportation needs:    Medical: Not on file    Non-medical: Not on file  Tobacco Use  . Smoking status: Never Smoker  . Smokeless tobacco: Never Used  Substance and Sexual Activity  . Alcohol use: No    Alcohol/week: 0.0 standard drinks  . Drug use: No  . Sexual activity: Not on file  Lifestyle  . Physical activity:    Days per week: Not on file    Minutes per session: Not on file  . Stress: Not on file  Relationships  . Social connections:    Talks on phone: Not on file    Gets together: Not on file    Attends religious service: Not on file    Active member of club or organization: Not on file    Attends meetings of clubs or organizations: Not on file    Relationship status: Not on file  . Intimate partner violence:    Fear of current or ex partner: Not on file    Emotionally abused: Not on file    Physically abused: Not on file    Forced sexual activity: Not on file  Other Topics Concern  . Not on file  Social History Narrative   Patient is married and lives at home with her husband Vinetta Bergamo).Thomas.    Patient does not work.    Education college.   Right handed.   Caffeine two cups of coffee daily.      PHYSICAL EXAM Generalized: Well developed, in no acute distress   Neurological examination  Mentation: Alert oriented to time, place, history taking. Follows all commands speech and language fluent Cranial nerve II-XII:Extraocular movements were full. Facial symmetry noted. uvula tongue midline. Head turning and shoulder shrug  were normal and symmetric. Motor: Good strength throughout subjectively per patient Sensory: Sensory testing is intact to soft touch on all 4 extremities subjectively per patient Coordination: Cerebellar testing reveals good finger-nose-finger  Gait and station: Patient is able to stand from a seated position. gait is normal.  Reflexes: UTA  DIAGNOSTIC DATA (LABS, IMAGING, TESTING) - I reviewed patient records, labs, notes, testing and imaging myself where available.     ASSESSMENT AND PLAN 63 y.o. year old female  has a past medical history of Anxiety and depression, Cervical spondylosis with myelopathy, Chronic interstitial cystitis, Diplopia, Memory deficit (12/27/2013), Memory disorder, Memory loss, Migraine without aura, without mention of intractable migraine without mention of status migrainosus, Myalgia and myositis, unspecified, Myalgia and myositis, unspecified, Other malaise and fatigue, PTSD (post-traumatic stress disorder), and Rheumatoid arthritis(714.0). here with :  1.  Migraine headaches 2.  Spondylosis of the cervical spine  3.  Fibromyalgia  The patient will continue on Lyrica for fibromyalgia.  She will continue on Topamax for her migraine headaches.  I have refilled Rizitriptan as well as Tylenol 3 for her to use for severe headaches.  I have advised that if her symptoms worsen or she  develops new symptoms she should let us know.  She will follow-up in 6 months or sooner if needed.   I did [facility] the [facility] drug registry prior to  filling Tylenol 3.   I spent 15 minutes with the patient this time was spent discussing her medication and plan of care.   Butch PennyMegan Luc Shammas, MSN, NP-C 05/03/2019, 2:03 PM [facility] [address], [address] [place], [place] [id number] [phone]

## 2019-05-03 NOTE — Progress Notes (Signed)
I have read the note, and I agree with the clinical assessment and plan.  Charles K Willis   

## 2019-05-04 ENCOUNTER — Encounter: Payer: Self-pay | Admitting: *Deleted

## 2019-05-04 ENCOUNTER — Telehealth: Payer: Self-pay | Admitting: Adult Health

## 2019-05-04 NOTE — Telephone Encounter (Signed)
Spoke to pt and relayed that we do have information from [facility] in care everywhere system.  Along with other facilities.  MM/NP is able to view if needed.

## 2019-05-04 NOTE — Telephone Encounter (Signed)
Pt called stating that she forgot to mention in her appt yesterday that in 11/22/18 she had an operation of her small bowl obstruction, she also has been diagnosed with a small leak in her heart and an amerism measuring 4.2cm. Please advise.

## 2019-05-18 ENCOUNTER — Other Ambulatory Visit: Payer: Self-pay | Admitting: Neurology

## 2019-07-17 ENCOUNTER — Other Ambulatory Visit: Payer: Self-pay | Admitting: Adult Health

## 2019-07-28 ENCOUNTER — Other Ambulatory Visit: Payer: Self-pay | Admitting: Adult Health

## 2019-07-28 NOTE — Telephone Encounter (Signed)
Pt is needing a refill on her pregabalin (LYRICA) 100 MG capsule sent to [place]

## 2019-08-01 MED ORDER — PREGABALIN 100 MG PO CAPS: 100.0000 mg | ORAL_CAPSULE | Freq: Three times a day (TID) | ORAL | 0 refills | Status: AC

## 2019-08-01 NOTE — Telephone Encounter (Signed)
Pt has called back stating she is being told by [facility] that they need the script for her pregabalin (LYRICA) 100 MG capsule by the 12th of this month.  Please call

## 2019-08-01 NOTE — Telephone Encounter (Signed)
Patient called back and I relayed the message below from [facility]

## 2019-08-01 NOTE — Telephone Encounter (Signed)
Drug registry checked lyrica last filled #[id number] 07-11-19. Next appt 11-08-19.

## 2019-08-12 ENCOUNTER — Other Ambulatory Visit: Payer: Self-pay | Admitting: Adult Health

## 2019-11-08 ENCOUNTER — Ambulatory Visit: Payer: BC Managed Care – PPO | Admitting: Adult Health

## 2020-04-05 ENCOUNTER — Other Ambulatory Visit: Payer: Self-pay | Admitting: Adult Health

## 2020-04-07 ENCOUNTER — Other Ambulatory Visit: Payer: Self-pay | Admitting: Adult Health

## 2023-01-13 ENCOUNTER — Ambulatory Visit (INDEPENDENT_AMBULATORY_CARE_PROVIDER_SITE_OTHER): Payer: Commercial Managed Care - PPO

## 2023-01-13 ENCOUNTER — Ambulatory Visit (INDEPENDENT_AMBULATORY_CARE_PROVIDER_SITE_OTHER): Payer: Commercial Managed Care - PPO | Admitting: Pulmonary Disease

## 2023-01-13 ENCOUNTER — Encounter: Payer: Self-pay | Admitting: Pulmonary Disease

## 2023-01-13 VITALS — BP 128/72 | HR 69 | Temp 98.1°F | Ht 65.0 in | Wt 194.8 lb

## 2023-01-13 DIAGNOSIS — R0989 Other specified symptoms and signs involving the circulatory and respiratory systems: Secondary | ICD-10-CM

## 2023-01-13 DIAGNOSIS — R059 Cough, unspecified: Secondary | ICD-10-CM

## 2023-01-13 DIAGNOSIS — M069 Rheumatoid arthritis, unspecified: Secondary | ICD-10-CM

## 2023-01-13 DIAGNOSIS — J45909 Unspecified asthma, uncomplicated: Secondary | ICD-10-CM | POA: Diagnosis not present

## 2023-01-13 DIAGNOSIS — J309 Allergic rhinitis, unspecified: Secondary | ICD-10-CM

## 2023-01-13 MED ORDER — AZELASTINE HCL 0.1 % NA SOLN: 2.0000 | Freq: Two times a day (BID) | NASAL | 12 refills | Status: DC

## 2023-01-13 NOTE — Patient Instructions (Signed)
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: Chest x-ray Sputum culture for bacterial, fungal, AFB organisms If either of these tests are abnormal we may need to check a high-resolution CT scan of the chest to look at your lungs further  Allergic rhinitis: Use NeilMed rinses over-the-counter twice daily to each nostril Continue taking Allegra Start taking Astelin nose sprays  History of asthmatic bronchitis: Trial of inhaled steroid/LABA: We will give Trelegy as it is we have samples of today 1 puff daily no matter how you feel If improvement with this then we can prescribe something like Breo  Follow-up with [place] in 1 month, sooner if needed

## 2023-01-13 NOTE — Progress Notes (Signed)
Synopsis: Referred in February 2024 for chronic cough Has a history of rheumatoid arthritis on methotrexate, ; previously took [place], Advair  Subjective:   PATIENT ID: [name] GENDER: female DOB: 03/20/1956, MRN: [id number]   HPI  Chief Complaint  Patient presents with  . Consult    Cough,pt has had antibiotics  and steroids. The cough continues coming back. Has been occurring since November     [name] is here to see me because she's had cough and mucus production since November. She took steroids and antibiotics in December and symptoms improved significantly, but not long after stopping she started producing mucus again.  She says that her cough has been present since then with daily mucus production. She ended up taking 50 pills of prednisone (taper) and a Zpack end of November to early December.  She was prescribed montelukast which didn't help.  She says that she continues to produce thin mucus.  She still has some sore throat, runny nose, cough productive of green mucus (most of the time).  She will cough up thin mucus from time to time which is not as thick and green as before.  She doesn't have fever or chills.  She has some headache.  She feels some swelling in her throat.   She has to take a deep breath from time to time, but generally not too dyspneic.  She is more fatigued than before.  No new chest pain compared to what she is used to experiencing from her RA.    Rheumatoid arthritis: diagnosed age 49 > it typically comes and goes > the last time she had a problem with it and had a flare was around November  She has an "extreme" amount of post nasal drip and sinus infections right now.  She isn't taking much for it right now.    Record review: No recent physicians office records available for my review December 23, 2022 primary care records reviewed indicating cough with mucus production, recent treatment with Z-Pak and steroids, prior history of asthma,  chest x-ray had been performed in the past which was normal.  Rheumatoid arthritis on methotrexate 7.5 mg weekly  Past Medical History:  Diagnosis Date  . Anxiety and depression   . Cervical spondylosis with myelopathy   . Chronic interstitial cystitis   . Diplopia   . 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 Memory deficit 12/27/2013  . Memory disorder   . Memory loss   . Migraine without aura, without mention of intractable migraine without mention of status migrainosus   . Myalgia and myositis, unspecified   . Myalgia and myositis, unspecified   . Other malaise and fatigue   . PTSD (post-traumatic stress disorder)   . Rheumatoid arthritis(714.0)      Family History  Problem Relation Age of Onset  . Heart failure Father   . Hypertension Mother      Social History   Socioeconomic History  . Marital status: Married    Spouse name: Marcello Moores   . Number of children: 3  . Years of education: college  . Highest education level: Not on file  Occupational History  . Not on file  Tobacco Use  . Smoking status: Never  . Smokeless tobacco: Never  Substance and Sexual Activity  . Alcohol use: No    Alcohol/week: 0.0 standard drinks of alcohol  . Drug use: No  . Sexual activity: Not on file  Other Topics Concern  .  Not on file  Social History Narrative   Patient is married and lives at home with her husband Dorice Lamas).[place].   Patient does not work.    Education college.   Right handed.   Caffeine two cups of coffee daily.   Social Determinants of Health   Financial Resource Strain: Not on file  Food Insecurity: Not on file  Transportation Needs: Not on file  Physical Activity: Not on file  Stress: Not on file  Social Connections: Not on file  Intimate Partner Violence: Not on file     Allergies  Allergen Reactions  . Asa [Aspirin] Shortness Of Breath and Swelling    Wheezing and throat swells  . Ibuprofen Other (See Comments) and Swelling    Wheezing and  throat swells Wheezing and throat swells     Outpatient Medications Prior to Visit  Medication Sig Dispense Refill  . b complex vitamins capsule Take 1 capsule by mouth daily.    . busPIRone (BUSPAR) 30 MG tablet Take 30 mg by mouth 2 (two) times daily.     . DULoxetine (CYMBALTA) 30 MG capsule Take 30 mg by mouth 3 (three) times daily.     . fexofenadine (ALLEGRA) 180 MG tablet Take 180 mg by mouth daily.    . folic acid (FOLVITE) 1 MG tablet Take 1 mg by mouth daily.     Marland Kitchen golimumab (SIMPONI ARIA) 50 MG/4ML SOLN injection Inject 50 mg into the vein now. Takes every other month    . hyoscyamine (ANASPAZ) 0.125 MG TBDP Place 0.125 mg under the tongue every 4 (four) hours as needed.    Marland Kitchen losartan (COZAAR) 25 MG tablet Take 12.5 mg by mouth daily.    . Multiple Vitamin (MULTIVITAMIN) capsule Take 1 capsule by mouth daily.    . potassium chloride (K-DUR,KLOR-CON) 10 MEQ tablet Take 10 mEq by mouth.    . pregabalin (LYRICA) 100 MG capsule Take 1 capsule (100 mg total) by mouth 3 (three) times daily. 270 capsule 0  . Probiotic Product (PROBIOTIC DAILY) CAPS Take by mouth.    . rizatriptan (MAXALT-MLT) 10 MG disintegrating tablet Take 1 tablet by mouth at the onset of migraine, may repeat in 2 hours. max of 2 doses per day 18 tablet 0  . sertraline (ZOLOFT) 100 MG tablet Take 200 mg by mouth daily.     Marland Kitchen tiZANidine (ZANAFLEX) 4 MG tablet Take 4 mg by mouth every 8 (eight) hours as needed for muscle spasms.    Marland Kitchen acetaminophen-codeine (TYLENOL #3) 300-30 MG tablet Take 1 tablet by mouth every 6 (six) hours as needed for moderate pain. 15 tablet 0  . azelastine (OPTIVAR) 0.05 % ophthalmic solution Place 0.05 drops into both eyes daily.    Marland Kitchen Bioflavonoid Products (C COMPLEX PO) Take 400 mg by mouth daily.    . Cholecalciferol (VITAMIN D3) 5000 units CAPS Take 5,000 Units by mouth.    . diphenhydrAMINE (BENADRYL) 12.5 MG chewable tablet Chew 12.5 mg by mouth daily.     Marland Kitchen estradiol (ESTRACE) 0.5 MG  tablet Take 1 mg by mouth daily.     . fenofibrate 54 MG tablet 54 mg daily.    . Glucosamine-Chondroitin-MSM 500-250-250 MG CAPS Take 3 tablets by mouth daily.     . hydrochlorothiazide (HYDRODIURIL) 25 MG tablet Take 25 mg by mouth daily.     . Ivermectin (SOOLANTRA) 1 % CREA Apply 1 application topically.    Marland Kitchen LORazepam (ATIVAN) 0.5 MG tablet Take 0.5 mg by mouth  3 (three) times daily.     . Magnesium 300 MG CAPS Take 800 mg by mouth daily. Takes two 800 chelated capsules daily per pt    . MALIC ACID PO Take [id number] mg by mouth daily.    . methocarbamol (ROBAXIN) 750 MG tablet TAKE 1 BY MOUTH 3 TIMES DAILY 270 tablet 1  . methotrexate (RHEUMATREX) 2.5 MG tablet 10/20/17 taking 4 tabs once a week    . omeprazole (PRILOSEC) 40 MG capsule Take 40 mg by mouth 2 (two) times daily.     . ORACEA 40 MG capsule Take 40 mg by mouth daily.  1  . rosuvastatin (CRESTOR) 10 MG tablet Take 10 mg by mouth daily.    Marland Kitchen topiramate (TOPAMAX) 50 MG tablet TAKE 1 BY MOUTH EVERY MORNING AND TAKE 2 BY MOUTH AT BEDTIME 270 tablet 0  . UNABLE TO FIND Med Name: Clifton Custard    . UNABLE TO FIND Med Name: Candida complete    . inFLIXimab (REMICADE) 100 MG injection     . leflunomide (ARAVA) 10 MG tablet Take 10 mg by mouth daily.    . Tocilizumab (ACTEMRA IV) Inject into the vein.     No facility-administered medications prior to visit.    Review of Systems  Constitutional:  Negative for chills, fever, malaise/fatigue and weight loss.  HENT:  Positive for congestion. Negative for nosebleeds, sinus pain and sore throat.   Eyes:  Negative for photophobia, pain and discharge.  Respiratory:  Positive for cough and shortness of breath. Negative for hemoptysis, sputum production and wheezing.   Cardiovascular:  Negative for chest pain, palpitations, orthopnea and leg swelling.  Gastrointestinal:  Negative for abdominal pain, constipation, diarrhea, nausea and vomiting.  Genitourinary:  Negative for dysuria,  frequency, hematuria and urgency.  Musculoskeletal:  Negative for back pain, joint pain, myalgias and neck pain.  Skin:  Negative for itching and rash.  Neurological:  Negative for tingling, tremors, sensory change, speech change, focal weakness, seizures, weakness and headaches.  Psychiatric/Behavioral:  Negative for memory loss, substance abuse and suicidal ideas. The patient is not nervous/anxious.       Objective:  Physical Exam   Vitals:   01/13/23 1432  BP: 128/72  Pulse: 69  Temp: 98.1 F (36.7 C)  TempSrc: Oral  SpO2: 95%  Weight: 194 lb 12.8 oz (88.4 kg)  Height: 5' 5"$  (1.651 m)    Gen: well appearing, no acute distress HENT: NCAT, OP clear, neck supple without masses Eyes: PERRL, EOMi Lymph: no cervical lymphadenopathy PULM: CTA B CV: RRR, no mgr, no JVD GI: BS+, soft, nontender, no hsm Derm: no rash or skin breakdown MSK: normal bulk and tone Neuro: A&Ox4, CN II-XII intact, strength 5/5 in all 4 extremities Psyche: labile mood: tearful, at times defensive   CBC No results found for: "WBC", "RBC", "HGB", "HCT", "PLT", "MCV", "MCH", "MCHC", "RDW", "LYMPHSABS", "MONOABS", "EOSABS", "BASOSABS"   Chest imaging: July 2023 CT abdomen lung windows independently reviewed showing normal pulmonary parenchyma, no fibrotic or other change, only bases of the lungs visible November 2023 two-view chest x-ray interpreted as normal without pulmonary parenchymal abnormality, neurostimulator noticed with leads in thoracic spine   PFT:  Labs:  Path:  Echo:  Heart Catheterization:       Assessment & Plan:   Cough, unspecified type - Plan: AFB Culture & Smear, Respiratory or Resp and Sputum Culture, DG Chest 2 View  Chest congestion - Plan: AFB Culture & Smear, Respiratory or Resp and Sputum  Culture, DG Chest 2 View  Rheumatoid arthritis, involving unspecified site, unspecified whether rheumatoid factor present (HCC)  Allergic rhinitis, unspecified seasonality,  unspecified trigger  Asthmatic bronchitis without complication, unspecified asthma severity, unspecified whether persistent  Discussion: Ms. [name] presents with ongoing cough and mucus production as an immunocompromised host.  This is in the setting of what sounds like poorly controlled allergic rhinitis and a history of asthmatic bronchitis.  I think the biggest problem driving her daily cough and mucus production is ongoing sinus congestion but as she is immunocompromise we need to look for evidence of an atypical or opportunistic organism.  Does not want to take Flonase or any nasal steroid due to history of epistaxis in the past.  Plan: 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: Chest x-ray Sputum culture for bacterial, fungal, AFB organisms If either of these tests are abnormal we may need to check a high-resolution CT scan of the chest to look at your lungs further  Allergic rhinitis: Use NeilMed rinses over-the-counter twice daily to each nostril Continue taking Allegra Start taking Astelin nose sprays  History of asthmatic bronchitis: Trial of inhaled steroid/LABA: We will give Trelegy as it is we have samples of today 1 puff daily no matter how you feel If improvement with this then we can prescribe something like Breo  Follow-up with [place] in 1 month, sooner if needed   Immunizations: Immunization History  Administered Date(s) Administered  . PFIZER Comirnaty(Gray Top)Covid-19 Tri-Sucrose Vaccine 01/14/2021     Current Outpatient Medications:  .  azelastine (ASTELIN) 0.1 % nasal spray, Place 2 sprays into both nostrils 2 (two) times daily. Use in each nostril as directed, Disp: 30 mL, Rfl: 12 .  b complex vitamins capsule, Take 1 capsule by mouth daily., Disp: , Rfl:  .  busPIRone (BUSPAR) 30 MG tablet, Take 30 mg by mouth 2 (two) times daily. , Disp: , Rfl:  .  DULoxetine (CYMBALTA) 30 MG capsule, Take 30 mg by mouth 3 (three) times daily. , Disp: , Rfl:  .   fexofenadine (ALLEGRA) 180 MG tablet, Take 180 mg by mouth daily., Disp: , Rfl:  .  folic acid (FOLVITE) 1 MG tablet, Take 1 mg by mouth daily. , Disp: , Rfl:  .  golimumab (SIMPONI ARIA) 50 MG/4ML SOLN injection, Inject 50 mg into the vein now. Takes every other month, Disp: , Rfl:  .  hyoscyamine (ANASPAZ) 0.125 MG TBDP, Place 0.125 mg under the tongue every 4 (four) hours as needed., Disp: , Rfl:  .  losartan (COZAAR) 25 MG tablet, Take 12.5 mg by mouth daily., Disp: , Rfl:  .  Multiple Vitamin (MULTIVITAMIN) capsule, Take 1 capsule by mouth daily., Disp: , Rfl:  .  potassium chloride (K-DUR,KLOR-CON) 10 MEQ tablet, Take 10 mEq by mouth., Disp: , Rfl:  .  pregabalin (LYRICA) 100 MG capsule, Take 1 capsule (100 mg total) by mouth 3 (three) times daily., Disp: 270 capsule, Rfl: 0 .  Probiotic Product (PROBIOTIC DAILY) CAPS, Take by mouth., Disp: , Rfl:  .  rizatriptan (MAXALT-MLT) 10 MG disintegrating tablet, Take 1 tablet by mouth at the onset of migraine, may repeat in 2 hours. max of 2 doses per day, Disp: 18 tablet, Rfl: 0 .  sertraline (ZOLOFT) 100 MG tablet, Take 200 mg by mouth daily. , Disp: , Rfl:  .  tiZANidine (ZANAFLEX) 4 MG tablet, Take 4 mg by mouth every 8 (eight) hours as needed for muscle spasms., Disp: , Rfl:  .  acetaminophen-codeine (  TYLENOL #3) 300-30 MG tablet, Take 1 tablet by mouth every 6 (six) hours as needed for moderate pain., Disp: 15 tablet, Rfl: 0 .  azelastine (OPTIVAR) 0.05 % ophthalmic solution, Place 0.05 drops into both eyes daily., Disp: , Rfl:  .  Bioflavonoid Products (C COMPLEX PO), Take 400 mg by mouth daily., Disp: , Rfl:  .  Cholecalciferol (VITAMIN D3) 5000 units CAPS, Take 5,000 Units by mouth., Disp: , Rfl:  .  diphenhydrAMINE (BENADRYL) 12.5 MG chewable tablet, Chew 12.5 mg by mouth daily. , Disp: , Rfl:  .  estradiol (ESTRACE) 0.5 MG tablet, Take 1 mg by mouth daily. , Disp: , Rfl:  .  fenofibrate 54 MG tablet, 54 mg daily., Disp: , Rfl:  .   Glucosamine-Chondroitin-MSM 500-250-250 MG CAPS, Take 3 tablets by mouth daily. , Disp: , Rfl:  .  hydrochlorothiazide (HYDRODIURIL) 25 MG tablet, Take 25 mg by mouth daily. , Disp: , Rfl:  .  Ivermectin (SOOLANTRA) 1 % CREA, Apply 1 application topically., Disp: , Rfl:  .  LORazepam (ATIVAN) 0.5 MG tablet, Take 0.5 mg by mouth 3 (three) times daily. , Disp: , Rfl:  .  Magnesium 300 MG CAPS, Take 800 mg by mouth daily. Takes two 800 chelated capsules daily per pt, Disp: , Rfl:  .  MALIC ACID PO, Take Q000111Q mg by mouth daily., Disp: , Rfl:  .  methocarbamol (ROBAXIN) 750 MG tablet, TAKE 1 BY MOUTH 3 TIMES DAILY, Disp: 270 tablet, Rfl: 1 .  methotrexate (RHEUMATREX) 2.5 MG tablet, 10/20/17 taking 4 tabs once a week, Disp: , Rfl:  .  omeprazole (PRILOSEC) 40 MG capsule, Take 40 mg by mouth 2 (two) times daily. , Disp: , Rfl:  .  ORACEA 40 MG capsule, Take 40 mg by mouth daily., Disp: , Rfl: 1 .  rosuvastatin (CRESTOR) 10 MG tablet, Take 10 mg by mouth daily., Disp: , Rfl:  .  topiramate (TOPAMAX) 50 MG tablet, TAKE 1 BY MOUTH EVERY MORNING AND TAKE 2 BY MOUTH AT BEDTIME, Disp: 270 tablet, Rfl: 0 .  UNABLE TO FIND, Med Name: Clifton Custard, Disp: , Rfl:  .  UNABLE TO FIND, Med Name: Candida complete, Disp: , Rfl:

## 2023-01-21 ENCOUNTER — Other Ambulatory Visit: Payer: Commercial Managed Care - PPO

## 2023-01-21 DIAGNOSIS — R0989 Other specified symptoms and signs involving the circulatory and respiratory systems: Secondary | ICD-10-CM

## 2023-01-21 DIAGNOSIS — R059 Cough, unspecified: Secondary | ICD-10-CM

## 2023-02-04 ENCOUNTER — Ambulatory Visit: Payer: BLUE CROSS/BLUE SHIELD | Admitting: Pulmonary Disease

## 2023-02-16 ENCOUNTER — Ambulatory Visit (HOSPITAL_BASED_OUTPATIENT_CLINIC_OR_DEPARTMENT_OTHER): Payer: Commercial Managed Care - PPO | Admitting: Pulmonary Disease

## 2023-03-10 ENCOUNTER — Other Ambulatory Visit (HOSPITAL_COMMUNITY): Payer: Self-pay

## 2023-03-10 ENCOUNTER — Telehealth (HOSPITAL_BASED_OUTPATIENT_CLINIC_OR_DEPARTMENT_OTHER): Payer: Self-pay | Admitting: Pulmonary Disease

## 2023-03-10 ENCOUNTER — Encounter (HOSPITAL_BASED_OUTPATIENT_CLINIC_OR_DEPARTMENT_OTHER): Payer: Self-pay | Admitting: Pulmonary Disease

## 2023-03-10 ENCOUNTER — Ambulatory Visit (INDEPENDENT_AMBULATORY_CARE_PROVIDER_SITE_OTHER): Payer: Commercial Managed Care - PPO | Admitting: Pulmonary Disease

## 2023-03-10 VITALS — BP 126/68 | HR 94 | Ht 65.0 in | Wt 188.2 lb

## 2023-03-10 DIAGNOSIS — R053 Chronic cough: Secondary | ICD-10-CM

## 2023-03-10 MED ORDER — FLUTICASONE FUROATE-VILANTEROL 200-25 MCG/ACT IN AEPB: 1.0000 | INHALATION_SPRAY | Freq: Every day | RESPIRATORY_TRACT | 5 refills | Status: DC

## 2023-03-10 NOTE — Progress Notes (Signed)
Synopsis: Referred in February 2024 for chronic cough Has a history of rheumatoid arthritis on methotrexate, ; previously took [place], Advair  Subjective:   PATIENT ID: [name] GENDER: female DOB: Oct 12, 1956, MRN: [id number]   HPI  Chief Complaint  Patient presents with   New Patient (Initial Visit)    Cough is still there    03/10/23 67 year old female with history of rheumatoid arthritis on methotrexate who presents as a new patient visit. Previously seen by Dr. Kendrick Fries. Prior clinic note reviewed. She has had chronic productive cough x 6 months that improved with steroids and antibiotics in December but persisted off medications. She reports since starting the Breo, the coughing has improved by at least 50-60%. Has deep congestion that she can produce cough.   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)   Memory deficit 12/27/2013   Memory disorder    Memory loss    Migraine without aura, without mention of intractable migraine without mention of status migrainosus    Myalgia and myositis, unspecified    Myalgia and myositis, unspecified    Other malaise and fatigue    PTSD (post-traumatic stress disorder)    Rheumatoid arthritis(714.0)      Family History  Problem Relation Age of Onset   Heart failure Father    Hypertension Mother      Social History   Socioeconomic History   Marital status: Married    Spouse name: Maisie Fus    Number of children: 3   Years of education: college   Highest education level: Not on file  Occupational History   Not on file  Tobacco Use   Smoking status: Never   Smokeless tobacco: Never  Substance and Sexual Activity   Alcohol use: No    Alcohol/week: 0.0 standard drinks of alcohol   Drug use: No   Sexual activity: Not on file  Other Topics Concern   Not on file   Social History Narrative   Patient is married and lives at home with her husband Building services engineer).Thomas.   Patient does not work.    Education college.   Right handed.   Caffeine two cups of coffee daily.   Social Determinants of Health   Financial Resource Strain: Not on file  Food Insecurity: Not on file  Transportation Needs: Not on file  Physical Activity: Not on file  Stress: Not on file  Social Connections: Not on file  Intimate Partner Violence: Not on file     Allergies  Allergen Reactions   Asa [Aspirin] Shortness Of Breath and Swelling    Wheezing and throat swells   Ibuprofen Other (See Comments) and Swelling    Wheezing and throat swells Wheezing and throat swells     Outpatient Medications Prior to Visit  Medication Sig Dispense Refill   b complex vitamins capsule Take 1 capsule by mouth daily.     busPIRone (BUSPAR) 30 MG tablet Take 30 mg by mouth 2 (two) times daily.      DULoxetine (CYMBALTA) 30 MG capsule Take 30 mg by mouth 3 (three) times daily.      esomeprazole (NEXIUM) 40 MG capsule Take 40 mg by mouth daily at 12 noon.     fexofenadine (ALLEGRA) 180 MG tablet Take 180 mg by mouth daily.  folic acid (FOLVITE) 1 MG tablet Take 1 mg by mouth daily.      golimumab (SIMPONI ARIA) 50 MG/4ML SOLN injection Inject 50 mg into the vein now. Takes every other month     hyoscyamine (ANASPAZ) 0.125 MG TBDP Place 0.125 mg under the tongue every 4 (four) hours as needed.     losartan (COZAAR) 25 MG tablet Take 12.5 mg by mouth daily.     Multiple Vitamin (MULTIVITAMIN) capsule Take 1 capsule by mouth daily.     potassium chloride (K-DUR,KLOR-CON) 10 MEQ tablet Take 10 mEq by mouth.     pregabalin (LYRICA) 100 MG capsule Take 1 capsule (100 mg total) by mouth 3 (three) times daily. 270 capsule 0   Probiotic Product (PROBIOTIC DAILY) CAPS Take by mouth.     rizatriptan (MAXALT-MLT) 10 MG disintegrating tablet Take 1 tablet by mouth at the onset of migraine, may repeat in  2 hours. max of 2 doses per day 18 tablet 0   sertraline (ZOLOFT) 100 MG tablet Take 200 mg by mouth daily.      tiZANidine (ZANAFLEX) 4 MG tablet Take 4 mg by mouth every 8 (eight) hours as needed for muscle spasms.     fluticasone furoate-vilanterol (BREO ELLIPTA) 100-25 MCG/ACT AEPB Inhale 1 puff into the lungs daily.     Cholecalciferol (VITAMIN D3) 5000 units CAPS Take 5,000 Units by mouth.     hydrochlorothiazide (HYDRODIURIL) 25 MG tablet Take 25 mg by mouth daily.      methotrexate (RHEUMATREX) 2.5 MG tablet 10/20/17 taking 4 tabs once a week     topiramate (TOPAMAX) 50 MG tablet TAKE 1 BY MOUTH EVERY MORNING AND TAKE 2 BY MOUTH AT BEDTIME 270 tablet 0   acetaminophen-codeine (TYLENOL #3) 300-30 MG tablet Take 1 tablet by mouth every 6 (six) hours as needed for moderate pain. 15 tablet 0   azelastine (ASTELIN) 0.1 % nasal spray Place 2 sprays into both nostrils 2 (two) times daily. Use in each nostril as directed 30 mL 12   azelastine (OPTIVAR) 0.05 % ophthalmic solution Place 0.05 drops into both eyes daily.     Bioflavonoid Products (C COMPLEX PO) Take 400 mg by mouth daily.     diphenhydrAMINE (BENADRYL) 12.5 MG chewable tablet Chew 12.5 mg by mouth daily.      estradiol (ESTRACE) 0.5 MG tablet Take 1 mg by mouth daily.      fenofibrate 54 MG tablet 54 mg daily.     Glucosamine-Chondroitin-MSM 500-250-250 MG CAPS Take 3 tablets by mouth daily.      Ivermectin (SOOLANTRA) 1 % CREA Apply 1 application topically.     LORazepam (ATIVAN) 0.5 MG tablet Take 0.5 mg by mouth 3 (three) times daily.      Magnesium 300 MG CAPS Take 800 mg by mouth daily. Takes two 800 chelated capsules daily per pt     MALIC ACID PO Take 800 mg by mouth daily.     methocarbamol (ROBAXIN) 750 MG tablet TAKE 1 BY MOUTH 3 TIMES DAILY 270 tablet 1   omeprazole (PRILOSEC) 40 MG capsule Take 40 mg by mouth 2 (two) times daily.      ORACEA 40 MG capsule Take 40 mg by mouth daily.  1   rosuvastatin (CRESTOR) 10 MG  tablet Take 10 mg by mouth daily.     UNABLE TO FIND Med Name: Wonda Cheng     UNABLE TO FIND Med Name: Candida complete     No facility-administered medications prior  to visit.    Review of Systems  Constitutional:  Negative for chills, diaphoresis, fever, malaise/fatigue and weight loss.  HENT:  Positive for congestion.   Respiratory:  Positive for cough, sputum production and shortness of breath. Negative for hemoptysis and wheezing.   Cardiovascular:  Negative for chest pain, palpitations and leg swelling.      Objective:  Physical Exam   Vitals:   03/10/23 1444  BP: 126/68  Pulse: 94  SpO2: 92%  Weight: 188 lb 3.2 oz (85.4 kg)  Height: 5\' 5"  (1.651 m)   Physical Exam: General: Well-appearing, no acute distress HENT: [place], AT Eyes: EOMI, no scleral icterus Respiratory: Bilateral rhonchi increased in RLL, central rattling, expiratory squeaks and wheezes Cardiovascular: RRR, -M/R/G, no JVD Extremities:-Edema,-tenderness Neuro: AAO x4, CNII-XII grossly intact Psych: Normal mood, normal affect    CBC No results found for: "WBC", "RBC", "HGB", "HCT", "PLT", "MCV", "MCH", "MCHC", "RDW", "LYMPHSABS", "MONOABS", "EOSABS", "BASOSABS"   Chest imaging: July 2023 CT abdomen lung windows independently reviewed showing normal pulmonary parenchyma, no fibrotic or other change, only bases of the lungs visible November 2023 two-view chest x-ray interpreted as normal without pulmonary parenchymal abnormality, neurostimulator noticed with leads in thoracic spine   PFT:  Labs:  Path:  Echo:  Heart Catheterization:       Assessment & Plan:   Chronic cough - Plan: CT Chest High Resolution  Discussion: 67 year old female with RA on methotrexate who presents for follow-up for recurrent asthmatic bronchitis. Partially improved on ICS/LABA. History of immunosuppression increase risk of atypical organisms.  Does not want to take Flonase or any nasal steroid due to  history of epistaxis in the past.  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 Spinal stimulator in place. --Insufficient sputum sample. AFB collected but not results. Respiratory culture not collected. Will contact lab --ORDER CT Chest without contrast --Order flutter valve  Allergic rhinitis: --Use NeilMed rinses over-the-counter twice daily to each nostril --Continue taking Allegra  History of asthmatic bronchitis: --INCREASE Breo 200-25 ONE puff ONCE a day.   Follow-up with me in 3 months  Immunizations: Immunization History  Administered Date(s) Administered   Hepatitis B, PED/ADOLESCENT 03/26/2006, 04/26/2006, 10/28/2006   Influenza Split 10/23/2015   Influenza, Quadrivalent, Recombinant, Inj, Pf 09/09/2017   Influenza,inj,Quad PF,6+ Mos 09/25/2016, 08/31/2020, 09/18/2021, 10/02/2022   Influenza-Unspecified 08/16/2018, 08/01/2019   PFIZER Comirnaty(Gray Top)Covid-19 Tri-Sucrose Vaccine 01/14/2021   PFIZER(Purple Top)SARS-COV-2 Vaccination 03/01/2020, 03/25/2020   Pneumococcal Polysaccharide-23 08/19/2006   Td (Adult),5 Lf Tetanus Toxid, Preservative Free 03/26/2006   Tdap 05/11/2016   Zoster Recombinat (Shingrix) 11/27/2019, 01/29/2020     Current Outpatient Medications:    b complex vitamins capsule, Take 1 capsule by mouth daily., Disp: , Rfl:    busPIRone (BUSPAR) 30 MG tablet, Take 30 mg by mouth 2 (two) times daily. , Disp: , Rfl:    DULoxetine (CYMBALTA) 30 MG capsule, Take 30 mg by mouth 3 (three) times daily. , Disp: , Rfl:    esomeprazole (NEXIUM) 40 MG capsule, Take 40 mg by mouth daily at 12 noon., Disp: , Rfl:    fexofenadine (ALLEGRA) 180 MG tablet, Take 180 mg by mouth daily., Disp: , Rfl:    fluticasone furoate-vilanterol (BREO ELLIPTA) 200-25 MCG/ACT AEPB, Inhale 1 puff into the lungs daily., Disp: 60 each, Rfl: 5   folic acid (FOLVITE) 1 MG tablet, Take 1 mg by mouth daily. , Disp: , Rfl:    golimumab  (SIMPONI ARIA) 50 MG/4ML  SOLN injection, Inject 50 mg into the vein now. Takes every other month, Disp: , Rfl:    hyoscyamine (ANASPAZ) 0.125 MG TBDP, Place 0.125 mg under the tongue every 4 (four) hours as needed., Disp: , Rfl:    losartan (COZAAR) 25 MG tablet, Take 12.5 mg by mouth daily., Disp: , Rfl:    Multiple Vitamin (MULTIVITAMIN) capsule, Take 1 capsule by mouth daily., Disp: , Rfl:    potassium chloride (K-DUR,KLOR-CON) 10 MEQ tablet, Take 10 mEq by mouth., Disp: , Rfl:    pregabalin (LYRICA) 100 MG capsule, Take 1 capsule (100 mg total) by mouth 3 (three) times daily., Disp: 270 capsule, Rfl: 0   Probiotic Product (PROBIOTIC DAILY) CAPS, Take by mouth., Disp: , Rfl:    rizatriptan (MAXALT-MLT) 10 MG disintegrating tablet, Take 1 tablet by mouth at the onset of migraine, may repeat in 2 hours. max of 2 doses per day, Disp: 18 tablet, Rfl: 0   sertraline (ZOLOFT) 100 MG tablet, Take 200 mg by mouth daily. , Disp: , Rfl:    tiZANidine (ZANAFLEX) 4 MG tablet, Take 4 mg by mouth every 8 (eight) hours as needed for muscle spasms., Disp: , Rfl:    Cholecalciferol (VITAMIN D3) 5000 units CAPS, Take 5,000 Units by mouth., Disp: , Rfl:    hydrochlorothiazide (HYDRODIURIL) 25 MG tablet, Take 25 mg by mouth daily. , Disp: , Rfl:    methotrexate (RHEUMATREX) 2.5 MG tablet, 10/20/17 taking 4 tabs once a week, Disp: , Rfl:    topiramate (TOPAMAX) 50 MG tablet, TAKE 1 BY MOUTH EVERY MORNING AND TAKE 2 BY MOUTH AT BEDTIME, Disp: 270 tablet, Rfl: 0

## 2023-03-10 NOTE — Patient Instructions (Addendum)
History of asthmatic bronchitis: CONTINUE Breo ONE puff ONCE a day. 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 Spinal stimulator in place. Insufficient sputum sample. AFB collected but not results. Respiratory culture not collected ORDER CT Chest without contrast  Follow-up with me in 3 months

## 2023-03-10 NOTE — Telephone Encounter (Signed)
Missing AFB results.  Please contact lab regarding collected AFB on 01/21/23.  Please also order flutter valve for patient

## 2023-03-11 NOTE — Telephone Encounter (Signed)
[name] please review afb lab was collected no results

## 2023-03-11 NOTE — Telephone Encounter (Signed)
Sent to [facility] will result around 03/18/2023 Afb cultures take several weeks to result.

## 2023-03-19 ENCOUNTER — Ambulatory Visit (HOSPITAL_BASED_OUTPATIENT_CLINIC_OR_DEPARTMENT_OTHER)
Admission: RE | Admit: 2023-03-19 | Discharge: 2023-03-19 | Disposition: A | Discharge status: Home / Self Care | Payer: Commercial Managed Care - PPO | Source: Ambulatory Visit | Attending: Pulmonary Disease | Admitting: Pulmonary Disease

## 2023-03-19 DIAGNOSIS — R053 Chronic cough: Secondary | ICD-10-CM | POA: Diagnosis present

## 2023-03-20 LAB — AFB CULTURE WITH SMEAR (NOT AT ARMC)
Acid Fast Culture: NEGATIVE
Acid Fast Smear: NEGATIVE

## 2023-04-06 ENCOUNTER — Encounter (HOSPITAL_BASED_OUTPATIENT_CLINIC_OR_DEPARTMENT_OTHER): Payer: Self-pay | Admitting: Pulmonary Disease

## 2023-04-06 ENCOUNTER — Other Ambulatory Visit: Payer: Commercial Managed Care - PPO

## 2023-04-06 DIAGNOSIS — R0989 Other specified symptoms and signs involving the circulatory and respiratory systems: Secondary | ICD-10-CM

## 2023-04-06 DIAGNOSIS — R059 Cough, unspecified: Secondary | ICD-10-CM

## 2023-04-07 ENCOUNTER — Encounter (HOSPITAL_BASED_OUTPATIENT_CLINIC_OR_DEPARTMENT_OTHER): Payer: Self-pay | Admitting: Pulmonary Disease

## 2023-04-07 ENCOUNTER — Ambulatory Visit (INDEPENDENT_AMBULATORY_CARE_PROVIDER_SITE_OTHER): Payer: Commercial Managed Care - PPO | Admitting: Pulmonary Disease

## 2023-04-07 VITALS — BP 144/66 | HR 86 | Temp 98.2°F | Ht 65.0 in | Wt 193.4 lb

## 2023-04-07 DIAGNOSIS — J45909 Unspecified asthma, uncomplicated: Secondary | ICD-10-CM | POA: Diagnosis not present

## 2023-04-07 DIAGNOSIS — R059 Cough, unspecified: Secondary | ICD-10-CM | POA: Diagnosis not present

## 2023-04-07 MED ORDER — PREDNISONE 10 MG PO TABS: ORAL_TABLET | ORAL | 0 refills | Status: DC

## 2023-04-07 MED ORDER — BREZTRI AEROSPHERE 160-9-4.8 MCG/ACT IN AERO: 2.0000 | INHALATION_SPRAY | Freq: Two times a day (BID) | RESPIRATORY_TRACT | 0 refills | Status: DC

## 2023-04-07 NOTE — Telephone Encounter (Signed)
Appt scheduled and completed.  

## 2023-04-07 NOTE — Progress Notes (Signed)
Synopsis: Referred in February 2024 for chronic cough Has a history of rheumatoid arthritis on methotrexate, ; previously took [place], Advair  Subjective:   PATIENT ID: [name] GENDER: female DOB: May 01, 1956, MRN: [id number]   HPI  Chief Complaint  Patient presents with   Follow-up    Follow up. Patients cough isn't getting any better.    03/10/23 67 year old female with history of rheumatoid arthritis on methotrexate who presents as a new patient visit. Previously seen by Dr. Kendrick Fries. Prior clinic note reviewed. She has had chronic productive cough x 6 months that improved with steroids and antibiotics in December but persisted off medications. She reports since starting the Breo, the coughing has improved by at least 50-60%. Has deep congestion that she can produce cough.  04/07/23 Since our last visit increased Breo to 200 with some improvement but cough remains persistent and productive. Interferes with her daily life and social interactions. Difficult to carry conversations and even watching a show. Wheezing improved but will occur with exertion. Has been using flutter valve 20x once a day to assist with mucous clearance.  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)   Memory deficit 12/27/2013   Memory disorder    Memory loss    Migraine without aura, without mention of intractable migraine without mention of status migrainosus    Myalgia and myositis, unspecified    Myalgia and myositis, unspecified    Other malaise and fatigue    PTSD (post-traumatic stress disorder)    Rheumatoid arthritis(714.0)      Family History  Problem Relation Age of Onset   Heart failure Father    Hypertension Mother      Social History   Socioeconomic History   Marital status: Married    Spouse name: Maisie Fus    Number of  children: 3   Years of education: college   Highest education level: Not on file  Occupational History   Not on file  Tobacco Use   Smoking status: Never   Smokeless tobacco: Never  Substance and Sexual Activity   Alcohol use: No    Alcohol/week: 0.0 standard drinks of alcohol   Drug use: No   Sexual activity: Not on file  Other Topics Concern   Not on file  Social History Narrative   Patient is married and lives at home with her husband Building services engineer).Thomas.   Patient does not work.    Education college.   Right handed.   Caffeine two cups of coffee daily.   Social Determinants of Health   Financial Resource Strain: Not on file  Food Insecurity: Not on file  Transportation Needs: Not on file  Physical Activity: Not on file  Stress: Not on file  Social Connections: Not on file  Intimate Partner Violence: Not on file     Allergies  Allergen Reactions   Asa [Aspirin] Shortness Of Breath and Swelling    Wheezing and throat swells   Ibuprofen Other (See Comments) and Swelling    Wheezing and throat swells Wheezing and throat swells     Outpatient Medications Prior to Visit  Medication Sig Dispense Refill   b complex vitamins capsule Take 1 capsule by mouth daily.     busPIRone (BUSPAR) 30 MG tablet Take 30 mg by mouth 2 (  two) times daily.      Cholecalciferol (VITAMIN D3) 5000 units CAPS Take 2,000 Units by mouth.     DULoxetine (CYMBALTA) 30 MG capsule Take 30 mg by mouth 3 (three) times daily.      esomeprazole (NEXIUM) 40 MG capsule Take 40 mg by mouth daily at 12 noon.     fexofenadine (ALLEGRA) 180 MG tablet Take 180 mg by mouth daily.     fluticasone furoate-vilanterol (BREO ELLIPTA) 200-25 MCG/ACT AEPB Inhale 1 puff into the lungs daily. 60 each 5   folic acid (FOLVITE) 1 MG tablet Take 1 mg by mouth daily.      golimumab (SIMPONI ARIA) 50 MG/4ML SOLN injection Inject 50 mg into the vein now. Takes every other month     hydrochlorothiazide (HYDRODIURIL) 25 MG tablet  Take 25 mg by mouth daily.      hyoscyamine (ANASPAZ) 0.125 MG TBDP Place 0.125 mg under the tongue every 4 (four) hours as needed.     losartan (COZAAR) 25 MG tablet Take 12.5 mg by mouth daily.     methotrexate (RHEUMATREX) 2.5 MG tablet 10/20/17 taking 4 tabs once a week     Multiple Vitamin (MULTIVITAMIN) capsule Take 1 capsule by mouth daily.     potassium chloride (K-DUR,KLOR-CON) 10 MEQ tablet Take 10 mEq by mouth.     pregabalin (LYRICA) 100 MG capsule Take 1 capsule (100 mg total) by mouth 3 (three) times daily. 270 capsule 0   Probiotic Product (PROBIOTIC DAILY) CAPS Take by mouth.     rizatriptan (MAXALT-MLT) 10 MG disintegrating tablet Take 1 tablet by mouth at the onset of migraine, may repeat in 2 hours. max of 2 doses per day 18 tablet 0   sertraline (ZOLOFT) 100 MG tablet Take 200 mg by mouth daily.      topiramate (TOPAMAX) 50 MG tablet TAKE 1 BY MOUTH EVERY MORNING AND TAKE 2 BY MOUTH AT BEDTIME 270 tablet 0   tiZANidine (ZANAFLEX) 4 MG tablet Take 4 mg by mouth every 8 (eight) hours as needed for muscle spasms.     No facility-administered medications prior to visit.    Review of Systems  Constitutional:  Negative for chills, diaphoresis, fever, malaise/fatigue and weight loss.  HENT:  Positive for congestion.   Respiratory:  Positive for cough, sputum production, shortness of breath and wheezing. Negative for hemoptysis.   Cardiovascular:  Negative for chest pain, palpitations and leg swelling.      Objective:  Physical Exam   Vitals:   04/07/23 1029  BP: (!) 144/66  Pulse: 86  Temp: 98.2 F (36.8 C)  TempSrc: Oral  SpO2: 94%  Weight: 193 lb 6.4 oz (87.7 kg)  Height: 5\' 5"  (1.651 m)   Physical Exam: General: Well-appearing, no acute distress HENT: [place], AT Eyes: EOMI, no scleral icterus Respiratory: Clear to auscultation bilaterally.  No crackles, wheezing or rales Cardiovascular: RRR, -M/R/G, no JVD Extremities:-Edema,-tenderness Neuro: AAO x4,  CNII-XII grossly intact Psych: Normal mood, normal affect   CBC No results found for: "WBC", "RBC", "HGB", "HCT", "PLT", "MCV", "MCH", "MCHC", "RDW", "LYMPHSABS", "MONOABS", "EOSABS", "BASOSABS"  Chest imaging: July 2023 CT abdomen lung windows independently reviewed showing normal pulmonary parenchyma, no fibrotic or other change, only bases of the lungs visible November 2023 two-view chest x-ray interpreted as normal without pulmonary parenchymal abnormality, neurostimulator noticed with leads in thoracic spine  CT Chest 03/19/23 - No evidence of ILD. Fine centrilobular and ground glass nodularity in bases.   PFT: None on  file  Labs: CBC No results found for: "WBC", "RBC", "HGB", "HCT", "PLT", "MCV", "MCH", "MCHC", "RDW", "LYMPHSABS", "MONOABS", "EOSABS", "BASOSABS"  AFB 01/21/23 - neg    Assessment & Plan:   No diagnosis found.  Discussion: 67 year old female with RA on methotrexate who presents for follow-up for recurrent asthmatic bronchitis. Improved on increased ICS/LABA however productive cough and wheeze remains persistent despite bronchodilators and flutter valve use. 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 Spinal stimulator in place. --Reviewed CT in-clinic. AFB negative --Continue flutter valve 1-2x a day --Prednisone taper ordered --TRIAL for 1 month: Breztri inhaler TWO puffs in the morning and evening. Rinse mouth afterwards  History of asthmatic bronchitis: --RESTART Breo after you complete Breztri: Breo 200-25 ONE puff ONCE a day.   Allergic rhinitis: --Use NeilMed rinses over-the-counter twice daily to each nostril --Continue taking Allegra --Does not want to take Flonase or any nasal steroid due to history of epistaxis in the past  Follow-up with me in 3 months  Immunizations: Immunization History  Administered Date(s) Administered   Hepatitis B, PED/ADOLESCENT  03/26/2006, 04/26/2006, 10/28/2006   Influenza Split 10/23/2015   Influenza, Quadrivalent, Recombinant, Inj, Pf 09/09/2017   Influenza,inj,Quad PF,6+ Mos 09/25/2016, 08/31/2020, 09/18/2021, 10/02/2022   Influenza-Unspecified 08/16/2018, 08/01/2019   PFIZER Comirnaty(Gray Top)Covid-19 Tri-Sucrose Vaccine 03/25/2020, 01/14/2021   PFIZER(Purple Top)SARS-COV-2 Vaccination 03/01/2020, 03/25/2020   Pneumococcal Polysaccharide-23 08/19/2006   Td (Adult),5 Lf Tetanus Toxid, Preservative Free 03/26/2006   Tdap 05/11/2016   Zoster Recombinat (Shingrix) 11/27/2019, 01/29/2020     Current Outpatient Medications:    b complex vitamins capsule, Take 1 capsule by mouth daily., Disp: , Rfl:    busPIRone (BUSPAR) 30 MG tablet, Take 30 mg by mouth 2 (two) times daily. , Disp: , Rfl:    Cholecalciferol (VITAMIN D3) 5000 units CAPS, Take 2,000 Units by mouth., Disp: , Rfl:    DULoxetine (CYMBALTA) 30 MG capsule, Take 30 mg by mouth 3 (three) times daily. , Disp: , Rfl:    esomeprazole (NEXIUM) 40 MG capsule, Take 40 mg by mouth daily at 12 noon., Disp: , Rfl:    fexofenadine (ALLEGRA) 180 MG tablet, Take 180 mg by mouth daily., Disp: , Rfl:    fluticasone furoate-vilanterol (BREO ELLIPTA) 200-25 MCG/ACT AEPB, Inhale 1 puff into the lungs daily., Disp: 60 each, Rfl: 5   folic acid (FOLVITE) 1 MG tablet, Take 1 mg by mouth daily. , Disp: , Rfl:    golimumab (SIMPONI ARIA) 50 MG/4ML SOLN injection, Inject 50 mg into the vein now. Takes every other month, Disp: , Rfl:    hydrochlorothiazide (HYDRODIURIL) 25 MG tablet, Take 25 mg by mouth daily. , Disp: , Rfl:    hyoscyamine (ANASPAZ) 0.125 MG TBDP, Place 0.125 mg under the tongue every 4 (four) hours as needed., Disp: , Rfl:    losartan (COZAAR) 25 MG tablet, Take 12.5 mg by mouth daily., Disp: , Rfl:    methotrexate (RHEUMATREX) 2.5 MG tablet, 10/20/17 taking 4 tabs once a week, Disp: , Rfl:    Multiple Vitamin (MULTIVITAMIN) capsule, Take 1 capsule by mouth  daily., Disp: , Rfl:    potassium chloride (K-DUR,KLOR-CON) 10 MEQ tablet, Take 10 mEq by mouth., Disp: , Rfl:    pregabalin (LYRICA) 100 MG capsule, Take 1 capsule (100 mg total) by mouth 3 (three) times daily., Disp: 270 capsule, Rfl: 0   Probiotic Product (PROBIOTIC DAILY) CAPS, Take by mouth., Disp: , Rfl:  rizatriptan (MAXALT-MLT) 10 MG disintegrating tablet, Take 1 tablet by mouth at the onset of migraine, may repeat in 2 hours. max of 2 doses per day, Disp: 18 tablet, Rfl: 0   sertraline (ZOLOFT) 100 MG tablet, Take 200 mg by mouth daily. , Disp: , Rfl:    topiramate (TOPAMAX) 50 MG tablet, TAKE 1 BY MOUTH EVERY MORNING AND TAKE 2 BY MOUTH AT BEDTIME, Disp: 270 tablet, Rfl: 0

## 2023-04-07 NOTE — Patient Instructions (Signed)
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 Spinal stimulator in place. --Reviewed CT in-clinic. AFB negative --Continue flutter valve 1-2x a day --Prednisone taper ordered --TRIAL for 1 month: Breztri inhaler TWO puffs in the morning and evening. Rinse mouth afterwards  History of asthmatic bronchitis: --RESTART Breo after you complete Breztri: Breo 200-25 ONE puff ONCE a day.

## 2023-04-09 LAB — RESPIRATORY CULTURE OR RESPIRATORY AND SPUTUM CULTURE
MICRO NUMBER:: 14953910
RESULT:: NORMAL
SPECIMEN QUALITY:: ADEQUATE

## 2023-04-14 ENCOUNTER — Encounter (HOSPITAL_BASED_OUTPATIENT_CLINIC_OR_DEPARTMENT_OTHER): Payer: Self-pay | Admitting: Pulmonary Disease

## 2023-04-14 MED ORDER — BREZTRI AEROSPHERE 160-9-4.8 MCG/ACT IN AERO: 2.0000 | INHALATION_SPRAY | Freq: Two times a day (BID) | RESPIRATORY_TRACT | 5 refills | Status: DC

## 2023-04-14 MED ORDER — PREDNISONE 10 MG PO TABS: ORAL_TABLET | ORAL | 0 refills | Status: AC

## 2023-04-26 ENCOUNTER — Encounter (HOSPITAL_BASED_OUTPATIENT_CLINIC_OR_DEPARTMENT_OTHER): Payer: Self-pay | Admitting: Pulmonary Disease

## 2023-04-27 NOTE — Telephone Encounter (Signed)
Mychart message sent by pt:  [facility] (supporting Chi Mechele Collin, MD)20 hours ago (12:54 PM)    Hi Dr. Everardo All, I started the [facility] samples you gave me  (04/07/23) on their  Friday following (04/09/23). Nausea and liquid diarrhea (sorry) began Wednesday last week (04/21/23) and continues today. I read in the instructions that sometimes these are "transition" side effects. I just wanted to let you know.    I'm sure you can understand it's pretty unpleasant, and I wouldn't want to continue the [facility] if this side effect came with it. On the outside chance that this side effect remains, I'm hoping you might be able to find a replacement treatment for me that is as effective as the Breztri without the undesirable side effect.   Otherwise, the Breztri inhaler is working well to help me breathe, absorb all the liquid that used to be in my sinuses and lungs, and expel any gunk produced.    Thanks so much, Dr. Everardo All! God bless you. [name]   With Dr. Everardo All not showing up on [facility], routing to provider of the day. Dr. Francine Graven, please advise.

## 2023-05-11 ENCOUNTER — Encounter (HOSPITAL_BASED_OUTPATIENT_CLINIC_OR_DEPARTMENT_OTHER): Payer: Self-pay | Admitting: Pulmonary Disease

## 2023-05-19 ENCOUNTER — Encounter (HOSPITAL_BASED_OUTPATIENT_CLINIC_OR_DEPARTMENT_OTHER): Payer: Self-pay | Admitting: Pulmonary Disease

## 2023-07-20 ENCOUNTER — Ambulatory Visit (INDEPENDENT_AMBULATORY_CARE_PROVIDER_SITE_OTHER): Payer: Commercial Managed Care - PPO | Admitting: Pulmonary Disease

## 2023-07-20 ENCOUNTER — Encounter (HOSPITAL_BASED_OUTPATIENT_CLINIC_OR_DEPARTMENT_OTHER): Payer: Self-pay | Admitting: Pulmonary Disease

## 2023-07-20 VITALS — BP 138/80 | HR 78 | Resp 16 | Ht 65.0 in | Wt 194.4 lb

## 2023-07-20 DIAGNOSIS — J45909 Unspecified asthma, uncomplicated: Secondary | ICD-10-CM

## 2023-07-20 MED ORDER — TRELEGY ELLIPTA 200-62.5-25 MCG/ACT IN AEPB: 1.0000 | INHALATION_SPRAY | Freq: Every day | RESPIRATORY_TRACT | Status: DC

## 2023-07-20 NOTE — Patient Instructions (Signed)
History of asthmatic bronchitis: --Tried Breo - ineffective --Breztri - improved cough but persistent productive cough --HOLD Breztri --START Trelegy 200 mcg ONE puff ONCE a day.   >Please call if you this medication is effective

## 2023-07-20 NOTE — Progress Notes (Signed)
Synopsis: Referred in February 2024 for chronic cough Has a history of rheumatoid arthritis on methotrexate, ; previously took [place], Advair  Subjective:   PATIENT ID: [name] GENDER: female DOB: 1956-11-18, MRN: [id number]   HPI  Chief Complaint  Patient presents with   Follow-up    Asthmatic bronchitis without complication, unspecified asthma severity, unspecified whether persistent. Is about the same or maybe worse.    03/10/23 67 year old female with history of rheumatoid arthritis on methotrexate who presents as a new patient visit. Previously seen by Dr. Kendrick Fries. Prior clinic note reviewed. She has had chronic productive cough x 6 months that improved with steroids and antibiotics in December but persisted off medications. She reports since starting the Breo, the coughing has improved by at least 50-60%. Has deep congestion that she can produce cough.  04/07/23 Since our last visit increased Breo to 200 with some improvement but cough remains persistent and productive. Interferes with her daily life and social interactions. Difficult to carry conversations and even watching a show. Wheezing improved but will occur with exertion. Has been using flutter valve 20x once a day to assist with mucous clearance.  07/20/23 Since our last visit she has been doing well on Breztri with significant improvement in cough however she continues to have productive cough especially when laying down. Takes mucinex 1200 mg twice a day. Not needing flutter valve since mucous spontaneously comes up.  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)   Memory deficit 12/27/2013   Memory disorder    Memory loss    Migraine without aura, without mention of intractable migraine without mention of status migrainosus    Myalgia and myositis,  unspecified    Myalgia and myositis, unspecified    Other malaise and fatigue    PTSD (post-traumatic stress disorder)    Rheumatoid arthritis(714.0)      Family History  Problem Relation Age of Onset   Heart failure Father    Hypertension Mother      Social History   Socioeconomic History   Marital status: Married    Spouse name: Maisie Fus    Number of children: 3   Years of education: college   Highest education level: Not on file  Occupational History   Not on file  Tobacco Use   Smoking status: Never   Smokeless tobacco: Never  Substance and Sexual Activity   Alcohol use: No    Alcohol/week: 0.0 standard drinks of alcohol   Drug use: No   Sexual activity: Not on file  Other Topics Concern   Not on file  Social History Narrative   Patient is married and lives at home with her husband Building services engineer).Thomas.   Patient does not work.    Education college.   Right handed.   Caffeine two cups of coffee daily.   Social Determinants of Health   Financial Resource Strain: Low Risk  (11/30/2021)   Received from [facility] visits prior to 01/23/2023., [facility] visits prior to 01/23/2023.   Overall Financial Resource Strain (CARDIA)    Difficulty of Paying Living Expenses: Not hard at all  Food Insecurity: No Food Insecurity (06/23/2022)   [facility]   Hunger Vital Sign  Transportation Needs: No Transportation Needs (  06/23/2022)   Received from [facility]   PRAPARE - Transportation  Physical Activity: Insufficiently Active (11/30/2021)   Received [facility] visits prior to 01/23/2023., [facility] visits prior to 01/23/2023.   Exercise Vital Sign    Days of Exercise per Week: 4 days    Minutes of Exercise per Session: 20 min  Stress: No Stress Concern Present (11/30/2021)   [facility] prior to 01/23/2023., [facility] visits prior to 01/23/2023.   [employer] of Occupational Health - Occupational Stress Questionnaire    Feeling of Stress : Only a little  Social Connections: Socially Integrated (11/30/2021)   Received from [facility] visits prior to 01/23/2023., [facility] visits prior to 01/23/2023.   Social Connection and Isolation Panel [NHANES]    Frequency of Communication with Friends and Family: Three times a week    Frequency of Social Gatherings with Friends and Family: Once a week    Attends Religious Services: More than 4 times per year    Active Member of [employer] or Organizations: Yes    Attends Banker Meetings: More than 4 times per year    Marital Status: Married  Catering manager Violence: Not At Risk (11/30/2021)   [facility] prior to 01/23/2023., [facility] visits prior to 01/23/2023.   Humiliation, Afraid, Rape, and Kick questionnaire    Fear of Current or Ex-Partner: No    Emotionally Abused: No    Physically Abused: No    Sexually Abused: No     Allergies  Allergen Reactions   Asa [Aspirin] Shortness Of Breath and Swelling    Wheezing and throat swells   Ibuprofen Other (See Comments) and Swelling    Wheezing and throat swells Wheezing and throat swells     Outpatient Medications Prior to Visit  Medication Sig Dispense Refill   b complex vitamins capsule Take 1 capsule by mouth daily.     Budeson-Glycopyrrol-Formoterol (BREZTRI AEROSPHERE) 160-9-4.8 MCG/ACT AERO Inhale 2 puffs into the lungs 2 (two) times daily. 10.7 g 5   busPIRone (BUSPAR) 30 MG tablet Take 30 mg by mouth 2 (two) times daily.      Cholecalciferol (VITAMIN D3) 5000 units CAPS Take 2,000 Units by mouth.     DULoxetine (CYMBALTA) 30 MG capsule Take 30 mg by mouth 2 (two) times daily.     esomeprazole (NEXIUM) 40 MG capsule Take 40 mg by mouth 2 (two) times daily before a meal.      fenofibrate 54 MG tablet Take 27 mg by mouth daily.     fexofenadine (ALLEGRA) 180 MG tablet Take 180 mg by mouth daily.     folic acid (FOLVITE) 1 MG tablet Take 1 mg by mouth daily.      golimumab (SIMPONI ARIA) 50 MG/4ML SOLN injection Inject 50 mg into the vein now. Takes every other month     hydrochlorothiazide (HYDRODIURIL) 25 MG tablet Take 25 mg by mouth daily.      hyoscyamine (ANASPAZ) 0.125 MG TBDP Place 0.125 mg under the tongue every 4 (four) hours as needed.     losartan (COZAAR) 25 MG tablet Take 25 mg by mouth daily.     methotrexate (RHEUMATREX) 2.5 MG tablet Take 6 tablets by mouth once a week. 10/20/17 taking 4 tabs once a week     Multiple  Vitamin (MULTIVITAMIN) capsule Take 1 capsule by mouth daily.     potassium chloride (K-DUR,KLOR-CON) 10 MEQ tablet Take 10 mEq by mouth.     pregabalin (LYRICA) 100 MG capsule Take 1 capsule (100 mg total) by mouth 3 (three) times daily. 270 capsule 0   Probiotic Product (PROBIOTIC DAILY) CAPS Take by mouth.     rizatriptan (MAXALT-MLT) 10 MG disintegrating tablet Take 1 tablet by mouth at the onset of migraine, may repeat in 2 hours. max of 2 doses per day 18 tablet 0   rosuvastatin (CRESTOR) 20 MG tablet Take 20 mg by mouth at bedtime.     sertraline (ZOLOFT) 100 MG tablet Take 200 mg by mouth daily.      topiramate (TOPAMAX) 50 MG tablet TAKE 1 BY MOUTH EVERY MORNING AND TAKE 2 BY MOUTH AT BEDTIME 270 tablet 0   No facility-administered medications prior to visit.    Review of Systems  Constitutional:  Negative for chills, diaphoresis, fever, malaise/fatigue and weight loss.  HENT:  Negative for congestion.   Respiratory:  Positive for cough, sputum production and shortness of breath. Negative for hemoptysis and wheezing.   Cardiovascular:  Negative for chest pain, palpitations and leg swelling.      Objective:  Physical Exam   Vitals:   07/20/23 1502  BP: 138/80  Pulse: 78  Resp: 16  SpO2: 93%  Weight: 194 lb 6.4 oz  (88.2 kg)  Height: 5\' 5"  (1.651 m)   Physical Exam: General: Well-appearing, no acute distress HENT: [place], AT Eyes: EOMI, no scleral icterus Respiratory: Clear to auscultation bilaterally.  No crackles, wheezing or rales Cardiovascular: RRR, -M/R/G, no JVD Extremities:-Edema,-tenderness Neuro: AAO x4, CNII-XII grossly intact Psych: Normal mood, normal affect   Chest imaging: July 2023 CT abdomen lung windows independently reviewed showing normal pulmonary parenchyma, no fibrotic or other change, only bases of the lungs visible November 2023 two-view chest x-ray interpreted as normal without pulmonary parenchymal abnormality, neurostimulator noticed with leads in thoracic spine  CT Chest 03/19/23 - No evidence of ILD. Fine centrilobular and ground glass nodularity in bases.   PFT: None on file  Labs: CBC No results found for: "WBC", "RBC", "HGB", "HCT", "PLT", "MCV", "MCH", "MCHC", "RDW", "LYMPHSABS", "MONOABS", "EOSABS", "BASOSABS"  AFB 01/21/23 - neg    Assessment & Plan:   No diagnosis found.  Discussion: 67 year old female with RA on methotrexate who presents for follow-up for recurrent asthmatic bronchitis. Improved cough with Breztri however still having productive cough. Denies copious mucous production. Although she is doing better she is still not optimized. We discussed trialing Trelegy to see if the LAMA component is more effective in controlling her symptoms. If not, then we can return to [place].  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: uncontrolled --CXR reviewed and overall normal. Spinal stimulator in place. --Has not needed flutter valve due to spontanous sputum  History of asthmatic bronchitis: --Tried Breo - ineffective --Breztri - improved cough but persistent productive cough --HOLD Breztri --START Trelegy 200 mcg ONE puff ONCE a day.   >Please call if you this medication is effective  Allergic rhinitis: --Use NeilMed rinses over-the-counter  twice daily to each nostril --Continue taking Allegra --Does not want to take Flonase or any nasal steroid due to history of epistaxis in the past  Follow-up with me in 3 months  Immunizations: Immunization History  Administered Date(s) Administered   Hepatitis B, PED/ADOLESCENT 03/26/2006, 04/26/2006, 10/28/2006   Influenza Split 10/23/2015   Influenza, Quadrivalent, Recombinant, Inj,  Pf 09/09/2017   Influenza,inj,Quad PF,6+ Mos 09/25/2016, 08/31/2020, 09/18/2021, 10/02/2022   Influenza-Unspecified 08/16/2018, 08/01/2019   PFIZER Comirnaty(Gray Top)Covid-19 Tri-Sucrose Vaccine 03/25/2020, 01/14/2021   PFIZER(Purple Top)SARS-COV-2 Vaccination 03/01/2020, 03/25/2020   Pneumococcal Polysaccharide-23 08/19/2006   Td (Adult),5 Lf Tetanus Toxid, Preservative Free 03/26/2006   Tdap 05/11/2016   Zoster Recombinant(Shingrix) 11/27/2019, 01/29/2020     Current Outpatient Medications:    b complex vitamins capsule, Take 1 capsule by mouth daily., Disp: , Rfl:    Budeson-Glycopyrrol-Formoterol (BREZTRI AEROSPHERE) 160-9-4.8 MCG/ACT AERO, Inhale 2 puffs into the lungs 2 (two) times daily., Disp: 10.7 g, Rfl: 5   busPIRone (BUSPAR) 30 MG tablet, Take 30 mg by mouth 2 (two) times daily. , Disp: , Rfl:    Cholecalciferol (VITAMIN D3) 5000 units CAPS, Take 2,000 Units by mouth., Disp: , Rfl:    DULoxetine (CYMBALTA) 30 MG capsule, Take 30 mg by mouth 2 (two) times daily., Disp: , Rfl:    esomeprazole (NEXIUM) 40 MG capsule, Take 40 mg by mouth 2 (two) times daily before a meal., Disp: , Rfl:    fenofibrate 54 MG tablet, Take 27 mg by mouth daily., Disp: , Rfl:    fexofenadine (ALLEGRA) 180 MG tablet, Take 180 mg by mouth daily., Disp: , Rfl:    folic acid (FOLVITE) 1 MG tablet, Take 1 mg by mouth daily. , Disp: , Rfl:    golimumab (SIMPONI ARIA) 50 MG/4ML SOLN injection, Inject 50 mg into the vein now. Takes every other month, Disp: , Rfl:    hydrochlorothiazide (HYDRODIURIL) 25 MG tablet, Take 25  mg by mouth daily. , Disp: , Rfl:    hyoscyamine (ANASPAZ) 0.125 MG TBDP, Place 0.125 mg under the tongue every 4 (four) hours as needed., Disp: , Rfl:    losartan (COZAAR) 25 MG tablet, Take 25 mg by mouth daily., Disp: , Rfl:    methotrexate (RHEUMATREX) 2.5 MG tablet, Take 6 tablets by mouth once a week. 10/20/17 taking 4 tabs once a week, Disp: , Rfl:    Multiple Vitamin (MULTIVITAMIN) capsule, Take 1 capsule by mouth daily., Disp: , Rfl:    potassium chloride (K-DUR,KLOR-CON) 10 MEQ tablet, Take 10 mEq by mouth., Disp: , Rfl:    pregabalin (LYRICA) 100 MG capsule, Take 1 capsule (100 mg total) by mouth 3 (three) times daily., Disp: 270 capsule, Rfl: 0   Probiotic Product (PROBIOTIC DAILY) CAPS, Take by mouth., Disp: , Rfl:    rizatriptan (MAXALT-MLT) 10 MG disintegrating tablet, Take 1 tablet by mouth at the onset of migraine, may repeat in 2 hours. max of 2 doses per day, Disp: 18 tablet, Rfl: 0   rosuvastatin (CRESTOR) 20 MG tablet, Take 20 mg by mouth at bedtime., Disp: , Rfl:    sertraline (ZOLOFT) 100 MG tablet, Take 200 mg by mouth daily. , Disp: , Rfl:    topiramate (TOPAMAX) 50 MG tablet, TAKE 1 BY MOUTH EVERY MORNING AND TAKE 2 BY MOUTH AT BEDTIME, Disp: 270 tablet, Rfl: 0

## 2023-07-21 ENCOUNTER — Encounter (HOSPITAL_BASED_OUTPATIENT_CLINIC_OR_DEPARTMENT_OTHER): Payer: Self-pay | Admitting: Pulmonary Disease

## 2023-07-21 MED ORDER — TRELEGY ELLIPTA 200-62.5-25 MCG/ACT IN AEPB: 1.0000 | INHALATION_SPRAY | Freq: Every day | RESPIRATORY_TRACT | 5 refills | Status: DC

## 2023-09-23 ENCOUNTER — Encounter (HOSPITAL_BASED_OUTPATIENT_CLINIC_OR_DEPARTMENT_OTHER): Payer: Self-pay | Admitting: Pulmonary Disease

## 2023-10-25 ENCOUNTER — Ambulatory Visit (HOSPITAL_BASED_OUTPATIENT_CLINIC_OR_DEPARTMENT_OTHER): Payer: Commercial Managed Care - PPO | Admitting: Pulmonary Disease

## 2023-12-09 ENCOUNTER — Other Ambulatory Visit (HOSPITAL_COMMUNITY): Payer: Self-pay

## 2023-12-09 ENCOUNTER — Encounter (HOSPITAL_BASED_OUTPATIENT_CLINIC_OR_DEPARTMENT_OTHER): Payer: Self-pay | Admitting: Pulmonary Disease

## 2023-12-09 ENCOUNTER — Ambulatory Visit (HOSPITAL_BASED_OUTPATIENT_CLINIC_OR_DEPARTMENT_OTHER): Payer: Commercial Managed Care - PPO | Admitting: Pulmonary Disease

## 2023-12-09 VITALS — BP 128/60 | HR 77 | Resp 14 | Ht 65.0 in | Wt 185.2 lb

## 2023-12-09 DIAGNOSIS — R053 Chronic cough: Secondary | ICD-10-CM | POA: Diagnosis not present

## 2023-12-09 DIAGNOSIS — J42 Unspecified chronic bronchitis: Secondary | ICD-10-CM

## 2023-12-09 MED ORDER — BUDESONIDE 0.25 MG/2ML IN SUSP: 0.2500 mg | Freq: Two times a day (BID) | RESPIRATORY_TRACT | 5 refills | Status: DC

## 2023-12-09 MED ORDER — TRELEGY ELLIPTA 200-62.5-25 MCG/ACT IN AEPB: 1.0000 | INHALATION_SPRAY | Freq: Every day | RESPIRATORY_TRACT | 11 refills | Status: AC

## 2023-12-09 NOTE — Progress Notes (Signed)
Synopsis: Referred in February 2024 for chronic cough Has a history of rheumatoid arthritis on methotrexate, ; previously took [place], Advair  Subjective:   PATIENT ID: [name] GENDER: female DOB: 1956/04/30, MRN: [id number]   HPI  Chief Complaint  Patient presents with   Follow-up   03/10/23 68 year old female with history of rheumatoid arthritis on methotrexate who presents as a new patient visit. Previously seen by Dr. Kendrick Fries. Prior clinic note reviewed. She has had chronic productive cough x 6 months that improved with steroids and antibiotics in December but persisted off medications. She reports since starting the Breo, the coughing has improved by at least 50-60%. Has deep congestion that she can produce cough.  04/07/23 Since our last visit increased Breo to 200 with some improvement but cough remains persistent and productive. Interferes with her daily life and social interactions. Difficult to carry conversations and even watching a show. Wheezing improved but will occur with exertion. Has been using flutter valve 20x once a day to assist with mucous clearance.  07/20/23 Since our last visit she has been doing well on Breztri with significant improvement in cough however she continues to have productive cough especially when laying down. Takes mucinex 1200 mg twice a day. Not needing flutter valve since mucous spontaneously comes up.  12/09/23 Since our last visit she was changed to Trelegy and felt it has not been effective. Compliant with inhaler. She has had increased mucous productive leading to coughing fits. Will produce about 3 teaspoons but still has chest congestion. Has not used flutter valve recently.  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)   Memory deficit 12/27/2013   Memory disorder    Memory  loss    Migraine without aura, without mention of intractable migraine without mention of status migrainosus    Myalgia and myositis, unspecified    Myalgia and myositis, unspecified    Other malaise and fatigue    PTSD (post-traumatic stress disorder)    Rheumatoid arthritis(714.0)      Family History  Problem Relation Age of Onset   Heart failure Father    Hypertension Mother      Social History   Socioeconomic History   Marital status: Married    Spouse name: Maisie Fus    Number of children: 3   Years of education: college   Highest education level: Not on file  Occupational History   Not on file  Tobacco Use   Smoking status: Never   Smokeless tobacco: Never  Substance and Sexual Activity   Alcohol use: No    Alcohol/week: 0.0 standard drinks of alcohol   Drug use: No   Sexual activity: Not on file  Other Topics Concern   Not on file  Social History Narrative   Patient is married and lives at home with her husband Building services engineer).Thomas.   Patient does not work.    Education college.   Right handed.   Caffeine two cups of coffee daily.   Social Drivers of Health   Financial Resource Strain: Low Risk  (11/30/2021)   Received from [facility] visits prior to 01/23/2023., [facility] visits prior to 01/23/2023.   Overall Financial Resource Strain (CARDIA)    Difficulty of Paying Living Expenses: Not hard  at all  Food Insecurity: No Food Insecurity (06/23/2022)   [facility]   Hunger Vital Sign  Transportation Needs: No Transportation Needs (06/23/2022)   Received from [facility] - Transportation  Physical Activity: Insufficiently Active (11/30/2021)   Received from [facility] visits prior to 01/23/2023., [facility] visits prior to 01/23/2023.   Exercise Vital Sign    Days of Exercise per Week: 4 days    Minutes of Exercise per Session: 20 min  Stress: No Stress  Concern Present (11/30/2021)   [facility] visits prior to 01/23/2023., [facility] visits prior to 01/23/2023.   [employer] of Occupational Health - Occupational Stress Questionnaire    Feeling of Stress : Only a little  Social Connections: Socially Integrated (11/30/2021)   Received from [facility] visits prior to 01/23/2023., [facility] visits prior to 01/23/2023.   Social Connection and Isolation Panel [NHANES]    Frequency of Communication with Friends and Family: Three times a week    Frequency of Social Gatherings with Friends and Family: Once a week    Attends Religious Services: More than 4 times per year    Active Member of [employer] or Organizations: Yes    Attends Banker Meetings: More than 4 times per year    Marital Status: Married  Catering manager Violence: Not At Risk (11/30/2021)   [facility] visits prior to 01/23/2023., [facility] visits prior to 01/23/2023.   Humiliation, Afraid, Rape, and Kick questionnaire    Fear of Current or Ex-Partner: No    Emotionally Abused: No    Physically Abused: No    Sexually Abused: No     Allergies  Allergen Reactions   Asa [Aspirin] Shortness Of Breath and Swelling    Wheezing and throat swells   Ibuprofen Other (See Comments) and Swelling    Wheezing and throat swells Wheezing and throat swells   Fluticasone Other (See Comments)    "makes my nose bleed"     Outpatient Medications Prior to Visit  Medication Sig Dispense Refill   b complex vitamins capsule Take 1 capsule by mouth daily.     busPIRone (BUSPAR) 30 MG tablet Take 30 mg by mouth 2 (two) times daily.      Cholecalciferol (VITAMIN D3) 5000 units CAPS Take 2,000 Units by mouth.     DULoxetine (CYMBALTA) 30 MG capsule Take 30 mg by mouth 2 (two) times daily.     DULoxetine (CYMBALTA) 60 MG capsule Take 60  mg by mouth 2 (two) times daily.     fenofibrate 54 MG tablet Take 27 mg by mouth daily.     fexofenadine (ALLEGRA) 180 MG tablet Take 180 mg by mouth daily.     folic acid (FOLVITE) 1 MG tablet Take 1 mg by mouth daily.      golimumab (SIMPONI ARIA) 50 MG/4ML SOLN injection Inject 50 mg into the vein now. Takes every other month     hydrochlorothiazide (HYDRODIURIL) 25 MG tablet Take 25 mg by mouth daily.      losartan (COZAAR) 25 MG tablet Take 25 mg by mouth daily.     mirtazapine (REMERON) 15 MG tablet Take 15 mg by mouth at bedtime.     montelukast (SINGULAIR) 10 MG tablet Take 10 mg by mouth daily.  Multiple Vitamin (MULTIVITAMIN) capsule Take 1 capsule by mouth daily.     potassium chloride (K-DUR,KLOR-CON) 10 MEQ tablet Take 10 mEq by mouth.     pregabalin (LYRICA) 100 MG capsule Take 1 capsule (100 mg total) by mouth 3 (three) times daily. 270 capsule 0   Probiotic Product (PROBIOTIC DAILY) CAPS Take by mouth.     rizatriptan (MAXALT-MLT) 10 MG disintegrating tablet Take 1 tablet by mouth at the onset of migraine, may repeat in 2 hours. max of 2 doses per day 18 tablet 0   rosuvastatin (CRESTOR) 20 MG tablet Take 20 mg by mouth at bedtime.     sertraline (ZOLOFT) 100 MG tablet Take 200 mg by mouth daily.      topiramate (TOPAMAX) 100 MG tablet Take 100 mg by mouth 2 (two) times daily.     Fluticasone-Umeclidin-Vilant (TRELEGY ELLIPTA) 200-62.5-25 MCG/ACT AEPB Inhale 1 puff into the lungs daily. 60 each 5   esomeprazole (NEXIUM) 40 MG capsule Take 40 mg by mouth 2 (two) times daily before a meal.     hyoscyamine (ANASPAZ) 0.125 MG TBDP Place 0.125 mg under the tongue every 4 (four) hours as needed.     methotrexate (RHEUMATREX) 2.5 MG tablet Take 6 tablets by mouth once a week. 10/20/17 taking 4 tabs once a week     topiramate (TOPAMAX) 50 MG tablet TAKE 1 BY MOUTH EVERY MORNING AND TAKE 2 BY MOUTH AT BEDTIME 270 tablet 0   No facility-administered medications prior to visit.     Review of Systems  Constitutional:  Negative for chills, diaphoresis, fever, malaise/fatigue and weight loss.  HENT:  Negative for congestion.   Respiratory:  Positive for cough, sputum production, shortness of breath and wheezing. Negative for hemoptysis.   Cardiovascular:  Negative for chest pain, palpitations and leg swelling.      Objective:  Physical Exam   Vitals:   12/09/23 0931  BP: 128/60  Pulse: 77  Resp: 14  SpO2: 98%  Weight: 185 lb 3.2 oz (84 kg)  Height: 5\' 5"  (1.651 m)   Physical Exam: General: Well-appearing, no acute distress HENT: [place], AT Eyes: EOMI, no scleral icterus Respiratory: Mild central rhonchi present bilaterally.  No crackles, wheezing or rales Cardiovascular: RRR, -M/R/G, no JVD Extremities:-Edema,-tenderness Neuro: AAO x4, CNII-XII grossly intact Psych: Normal mood, normal affect   Chest imaging: July 2023 CT abdomen lung windows independently reviewed showing normal pulmonary parenchyma, no fibrotic or other change, only bases of the lungs visible November 2023 two-view chest x-ray interpreted as normal without pulmonary parenchymal abnormality, neurostimulator noticed with leads in thoracic spine  CT Chest 03/19/23 - No evidence of ILD. Fine centrilobular and ground glass nodularity in bases.   PFT: None on file  Labs: CBC No results found for: "WBC", "RBC", "HGB", "HCT", "PLT", "MCV", "MCH", "MCHC", "RDW", "LYMPHSABS", "MONOABS", "EOSABS", "BASOSABS"  AFB 01/21/23 - neg    Assessment & Plan:   Chronic cough - Plan: CT Chest High Resolution, Pulmonary function test  Chronic bronchitis, unspecified chronic bronchitis type [facility])  Discussion:  68 year old female with RA on methotrexate who presents for follow-up for persistent recurrent asthmatic bronchitis. Not improved on Breztri or Trelegy. Symptomatic with chest congestion and mucous production. Will further work-up as noted below with imaging and PFTs. Will plan to  transition to nebulizers. Pharmacy investigation performed. Will started with budesonide daily. May need bronchoscopy if work-up negative.  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 REFILL --ORDER CT Chest high resolution --ORDER pulmonary function tests  Allergic rhinitis: --Use NeilMed rinses over-the-counter twice daily to each nostril --Continue taking Allegra --Does not want to take Flonase or any nasal steroid due to history of epistaxis in the past  I have spent a total time of 30-minutes on the day of the appointment including chart review, data review, collecting history, coordinating care and discussing medical diagnosis and plan with the patient/family. Past medical history, allergies, medications were reviewed. Pertinent imaging, labs and tests included in this note have been reviewed and interpreted independently by me.   Mechele Collin, M.D. [facility] 12/09/2023 10:51 AM     Immunizations: Immunization History  Administered Date(s) Administered   Hepatitis B, PED/ADOLESCENT 03/26/2006, 04/26/2006, 10/28/2006   Influenza Split 10/23/2015   Influenza, Quadrivalent, Recombinant, Inj, Pf 09/09/2017   Influenza,inj,Quad PF,6+ Mos 09/25/2016, 08/31/2020, 09/18/2021, 10/02/2022   Influenza-Unspecified 08/16/2018, 08/01/2019, 07/26/2023   PFIZER Comirnaty(Gray Top)Covid-19 Tri-Sucrose Vaccine 03/25/2020, 01/14/2021   PFIZER(Purple Top)SARS-COV-2 Vaccination 03/01/2020, 03/25/2020   Pneumococcal Polysaccharide-23 08/19/2006   Td (Adult),5 Lf Tetanus Toxid, Preservative Free 03/26/2006   Tdap 05/11/2016   Zoster Recombinant(Shingrix) 11/27/2019,  01/29/2020     Current Outpatient Medications:    b complex vitamins capsule, Take 1 capsule by mouth daily., Disp: , Rfl:    budesonide (PULMICORT) 0.25 MG/2ML nebulizer solution, Take 2 mLs (0.25 mg total) by nebulization 2 (two) times daily., Disp: 120 mL, Rfl: 5   busPIRone (BUSPAR) 30 MG tablet, Take 30 mg by mouth 2 (two) times daily. , Disp: , Rfl:    Cholecalciferol (VITAMIN D3) 5000 units CAPS, Take 2,000 Units by mouth., Disp: , Rfl:    DULoxetine (CYMBALTA) 30 MG capsule, Take 30 mg by mouth 2 (two) times daily., Disp: , Rfl:    DULoxetine (CYMBALTA) 60 MG capsule, Take 60 mg by mouth 2 (two) times daily., Disp: , Rfl:    fenofibrate 54 MG tablet, Take 27 mg by mouth daily., Disp: , Rfl:    fexofenadine (ALLEGRA) 180 MG tablet, Take 180 mg by mouth daily., Disp: , Rfl:    folic acid (FOLVITE) 1 MG tablet, Take 1 mg by mouth daily. , Disp: , Rfl:    golimumab (SIMPONI ARIA) 50 MG/4ML SOLN injection, Inject 50 mg into the vein now. Takes every other month, Disp: , Rfl:    hydrochlorothiazide (HYDRODIURIL) 25 MG tablet, Take 25 mg by mouth daily. , Disp: , Rfl:    losartan (COZAAR) 25 MG tablet, Take 25 mg by mouth daily., Disp: , Rfl:    mirtazapine (REMERON) 15 MG tablet, Take 15 mg by mouth at bedtime., Disp: , Rfl:    montelukast (SINGULAIR) 10 MG tablet, Take 10 mg by mouth daily., Disp: , Rfl:    Multiple Vitamin (MULTIVITAMIN) capsule, Take 1 capsule by mouth daily., Disp: , Rfl:    potassium chloride (K-DUR,KLOR-CON) 10 MEQ tablet, Take 10 mEq by mouth., Disp: , Rfl:    pregabalin (LYRICA) 100 MG capsule, Take 1 capsule (100 mg total) by mouth 3 (three) times daily., Disp: 270 capsule, Rfl: 0   Probiotic Product (PROBIOTIC DAILY) CAPS, Take by mouth., Disp: , Rfl:    rizatriptan (MAXALT-MLT) 10 MG disintegrating tablet, Take 1 tablet by mouth at the onset of  migraine, may repeat in 2 hours. max of 2 doses per day, Disp: 18 tablet, Rfl: 0   rosuvastatin (CRESTOR) 20 MG tablet,  Take 20 mg by mouth at bedtime., Disp: , Rfl:    sertraline (ZOLOFT) 100 MG tablet, Take 200 mg by mouth daily. , Disp: , Rfl:    topiramate (TOPAMAX) 100 MG tablet, Take 100 mg by mouth 2 (two) times daily., Disp: , Rfl:    Fluticasone-Umeclidin-Vilant (TRELEGY ELLIPTA) 200-62.5-25 MCG/ACT AEPB, Inhale 1 puff into the lungs daily., Disp: 60 each, Rfl: 11

## 2023-12-09 NOTE — Patient Instructions (Addendum)
History of asthmatic bronchitis: --CONTINUE Trelegy 200 mcg ONE puff ONCE a day. 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

## 2023-12-10 ENCOUNTER — Encounter (HOSPITAL_BASED_OUTPATIENT_CLINIC_OR_DEPARTMENT_OTHER): Payer: Self-pay | Admitting: Pulmonary Disease

## 2023-12-10 NOTE — Telephone Encounter (Signed)
FYI, thanks.

## 2023-12-13 ENCOUNTER — Ambulatory Visit (HOSPITAL_BASED_OUTPATIENT_CLINIC_OR_DEPARTMENT_OTHER): Payer: Commercial Managed Care - PPO | Admitting: Pulmonary Disease

## 2023-12-17 ENCOUNTER — Ambulatory Visit (HOSPITAL_BASED_OUTPATIENT_CLINIC_OR_DEPARTMENT_OTHER): Payer: Commercial Managed Care - PPO | Admitting: Pulmonary Disease

## 2023-12-17 DIAGNOSIS — R053 Chronic cough: Secondary | ICD-10-CM | POA: Diagnosis not present

## 2023-12-17 LAB — PULMONARY FUNCTION TEST
DL/VA % pred: 111 %
DL/VA: 4.63 ml/min/mmHg/L
DLCO cor % pred: 119 %
DLCO cor: 24.45 ml/min/mmHg
DLCO unc % pred: 119 %
DLCO unc: 24.45 ml/min/mmHg
FEF 25-75 Post: 2.94 L/s
FEF 25-75 Pre: 3.31 L/s
FEF2575-%Change-Post: -11 %
FEF2575-%Pred-Post: 134 %
FEF2575-%Pred-Pre: 151 %
FEV1-%Change-Post: -1 %
FEV1-%Pred-Post: 115 %
FEV1-%Pred-Pre: 117 %
FEV1-Post: 2.91 L
FEV1-Pre: 2.95 L
FEV1FVC-%Change-Post: 0 %
FEV1FVC-%Pred-Pre: 110 %
FEV6-%Change-Post: 0 %
FEV6-%Pred-Post: 109 %
FEV6-%Pred-Pre: 110 %
FEV6-Post: 3.46 L
FEV6-Pre: 3.48 L
FEV6FVC-%Pred-Post: 104 %
FEV6FVC-%Pred-Pre: 104 %
FVC-%Change-Post: 0 %
FVC-%Pred-Post: 105 %
FVC-%Pred-Pre: 105 %
FVC-Post: 3.46 L
FVC-Pre: 3.48 L
Post FEV1/FVC ratio: 84 %
Post FEV6/FVC ratio: 100 %
Pre FEV1/FVC ratio: 85 %
Pre FEV6/FVC Ratio: 100 %
RV % pred: 96 %
RV: 2.07 L
TLC % pred: 110 %
TLC: 5.75 L

## 2023-12-17 NOTE — Patient Instructions (Signed)
Full PFT Performed Today

## 2023-12-17 NOTE — Progress Notes (Signed)
Full PFT Performed Today

## 2024-01-06 ENCOUNTER — Ambulatory Visit (HOSPITAL_BASED_OUTPATIENT_CLINIC_OR_DEPARTMENT_OTHER)
Admission: RE | Admit: 2024-01-06 | Discharge: 2024-01-06 | Disposition: A | Discharge status: Home / Self Care | Payer: Commercial Managed Care - PPO | Source: Ambulatory Visit | Attending: Pulmonary Disease | Admitting: Pulmonary Disease

## 2024-01-06 DIAGNOSIS — R053 Chronic cough: Secondary | ICD-10-CM | POA: Insufficient documentation

## 2024-01-20 ENCOUNTER — Ambulatory Visit (HOSPITAL_BASED_OUTPATIENT_CLINIC_OR_DEPARTMENT_OTHER): Payer: Commercial Managed Care - PPO | Admitting: Pulmonary Disease

## 2024-01-20 ENCOUNTER — Encounter (HOSPITAL_BASED_OUTPATIENT_CLINIC_OR_DEPARTMENT_OTHER): Payer: Self-pay | Admitting: Pulmonary Disease

## 2024-01-20 VITALS — BP 118/74 | HR 71 | Ht 65.0 in | Wt 187.0 lb

## 2024-01-20 DIAGNOSIS — J309 Allergic rhinitis, unspecified: Secondary | ICD-10-CM | POA: Diagnosis not present

## 2024-01-20 DIAGNOSIS — J42 Unspecified chronic bronchitis: Secondary | ICD-10-CM

## 2024-01-20 DIAGNOSIS — J45909 Unspecified asthma, uncomplicated: Secondary | ICD-10-CM

## 2024-01-20 NOTE — Progress Notes (Signed)
 Synopsis: Referred in February 2024 for chronic cough Has a history of rheumatoid arthritis on methotrexate, ; previously took [place], Advair  Subjective:   PATIENT ID: [name] GENDER: female DOB: 1956-10-02, MRN: [id number]   HPI  Chief Complaint  Patient presents with   Follow-up    Cough   03/10/23 68 year old female with history of rheumatoid arthritis on methotrexate who presents as a new patient visit. Previously seen by Dr. Kendrick Fries. Prior clinic note reviewed. She has had chronic productive cough x 6 months that improved with steroids and antibiotics in December but persisted off medications. She reports since starting the Breo, the coughing has improved by at least 50-60%. Has deep congestion that she can produce cough.  04/07/23 Since our last visit increased Breo to 200 with some improvement but cough remains persistent and productive. Interferes with her daily life and social interactions. Difficult to carry conversations and even watching a show. Wheezing improved but will occur with exertion. Has been using flutter valve 20x once a day to assist with mucous clearance.  07/20/23 Since our last visit she has been doing well on Breztri with significant improvement in cough however she continues to have productive cough especially when laying down. Takes mucinex 1200 mg twice a day. Not needing flutter valve since mucous spontaneously comes up.  12/09/23 Since our last visit she was changed to Trelegy and felt it has not been effective. Compliant with inhaler. She has had increased mucous productive leading to coughing fits. Will produce about 3 teaspoons but still has chest congestion. Has not used flutter valve recently.  01/20/24 Since our last visit she reports her mucous production has increased and has been using flutter valve. Her production is estimated to be 1/2 a dixie cup daily. Breathing does improve once the mucous comes up but frustrated this is continuous and  feels people are judging her cough. 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)   Memory deficit 12/27/2013   Memory disorder    Memory loss    Migraine without aura, without mention of intractable migraine without mention of status migrainosus    Myalgia and myositis, unspecified    Myalgia and myositis, unspecified    Other malaise and fatigue    PTSD (post-traumatic stress disorder)    Rheumatoid arthritis(714.0)      Family History  Problem Relation Age of Onset   Heart failure Father    Hypertension Mother      Social History   Socioeconomic History   Marital status: Married    Spouse name: Maisie Fus    Number of children: 3   Years of education: college   Highest education level: Not on file  Occupational History   Not on file  Tobacco Use   Smoking status: Never   Smokeless tobacco: Never  Substance and Sexual Activity   Alcohol use: No    Alcohol/week: 0.0 standard drinks of alcohol   Drug use: No   Sexual activity: Not on file  Other Topics Concern   Not on file  Social History Narrative   Patient is married and lives at home with her husband Building services engineer).Thomas.   Patient does not work.    Education college.   Right handed.   Caffeine  two cups of coffee daily.   Social Drivers of Health   Financial Resource Strain: Low Risk  (11/30/2021)   Received from [facility] visits prior to 01/23/2023., [facility] visits prior to 01/23/2023.   Overall Financial Resource Strain (CARDIA)    Difficulty of Paying Living Expenses: Not hard at all  Food Insecurity: No Food Insecurity (06/23/2022)   [facility]   Hunger Vital Sign  Transportation Needs: No Transportation Needs (06/23/2022)   Received from [facility] -  Transportation  Physical Activity: Insufficiently Active (11/30/2021)   Received from [facility] visits prior to 01/23/2023., [facility] visits prior to 01/23/2023.   Exercise Vital Sign    Days of Exercise per Week: 4 days    Minutes of Exercise per Session: 20 min  Stress: No Stress Concern Present (11/30/2021)   [facility] visits prior to 01/23/2023., [facility] visits prior to 01/23/2023.   [employer] of Occupational Health - Occupational Stress Questionnaire    Feeling of Stress : Only a little  Social Connections: Socially Integrated (11/30/2021)   Received from [facility] visits prior to 01/23/2023., [facility] visits prior to 01/23/2023.   Social Connection and Isolation Panel [NHANES]    Frequency of Communication with Friends and Family: Three times a week    Frequency of Social Gatherings with Friends and Family: Once a week    Attends Religious Services: More than 4 times per year    Active Member of [employer] or Organizations: Yes    Attends Banker Meetings: More than 4 times per year    Marital Status: Married  Catering manager Violence: Not At Risk (11/30/2021)   [facility] visits prior to 01/23/2023., [facility] visits prior to 01/23/2023.   Humiliation, Afraid, Rape, and Kick questionnaire    Fear of Current or Ex-Partner: No    Emotionally Abused: No    Physically Abused: No    Sexually Abused: No     Allergies  Allergen Reactions   Asa [Aspirin] Shortness Of Breath and Swelling    Wheezing and throat swells   Ibuprofen Other (See Comments) and Swelling    Wheezing and throat swells Wheezing and throat swells   Fluticasone Other (See Comments)    "makes my nose bleed"     Outpatient Medications Prior to Visit  Medication Sig Dispense Refill   b complex  vitamins capsule Take 1 capsule by mouth daily.     budesonide (PULMICORT) 0.25 MG/2ML nebulizer solution Take 2 mLs (0.25 mg total) by nebulization 2 (two) times daily. 120 mL 5   busPIRone (BUSPAR) 30 MG tablet Take 30 mg by mouth 2 (two) times daily.      Cholecalciferol (VITAMIN D3) 5000 units CAPS Take 2,000 Units by mouth.     DULoxetine (CYMBALTA) 30 MG capsule Take 30 mg by mouth 2 (two) times daily.     DULoxetine (CYMBALTA) 60 MG capsule Take 60 mg by mouth 2 (two) times daily.     fenofibrate 54 MG tablet Take 27 mg by mouth daily.     fexofenadine (ALLEGRA) 180 MG tablet Take 180 mg by mouth daily.     Fluticasone-Umeclidin-Vilant (TRELEGY ELLIPTA) 200-62.5-25 MCG/ACT AEPB Inhale 1 puff into the lungs daily. 60 each 11   folic  acid (FOLVITE) 1 MG tablet Take 1 mg by mouth daily.      golimumab (SIMPONI ARIA) 50 MG/4ML SOLN injection Inject 50 mg into the vein now. Takes every other month     hydrochlorothiazide (HYDRODIURIL) 25 MG tablet Take 25 mg by mouth daily.      losartan (COZAAR) 25 MG tablet Take 25 mg by mouth daily.     mirtazapine (REMERON) 15 MG tablet Take 15 mg by mouth at bedtime.     montelukast (SINGULAIR) 10 MG tablet Take 10 mg by mouth daily.     Multiple Vitamin (MULTIVITAMIN) capsule Take 1 capsule by mouth daily.     potassium chloride (K-DUR,KLOR-CON) 10 MEQ tablet Take 10 mEq by mouth.     pregabalin (LYRICA) 100 MG capsule Take 1 capsule (100 mg total) by mouth 3 (three) times daily. 270 capsule 0   Probiotic Product (PROBIOTIC DAILY) CAPS Take by mouth.     rizatriptan (MAXALT-MLT) 10 MG disintegrating tablet Take 1 tablet by mouth at the onset of migraine, may repeat in 2 hours. max of 2 doses per day 18 tablet 0   rosuvastatin (CRESTOR) 20 MG tablet Take 20 mg by mouth at bedtime.     sertraline (ZOLOFT) 100 MG tablet Take 200 mg by mouth daily.      topiramate (TOPAMAX) 100 MG tablet Take 100 mg by mouth 2 (two) times daily.     No  facility-administered medications prior to visit.    Review of Systems  Constitutional:  Negative for chills, diaphoresis, fever, malaise/fatigue and weight loss.  HENT:  Negative for congestion.   Respiratory:  Positive for cough, sputum production and shortness of breath. Negative for hemoptysis and wheezing.   Cardiovascular:  Negative for chest pain, palpitations and leg swelling.      Objective:  Physical Exam   Vitals:   01/20/24 1551  BP: 118/74  Pulse: 71  SpO2: 97%  Weight: 187 lb (84.8 kg)  Height: 5\' 5"  (1.651 m)   Physical Exam: General: Well-appearing, no acute distress HENT: [place], AT Eyes: EOMI, no scleral icterus Respiratory: Clear to auscultation bilaterally.  No crackles, wheezing or rales Cardiovascular: RRR, -M/R/G, no JVD Extremities:-Edema,-tenderness Neuro: AAO x4, CNII-XII grossly intact Psych: Normal mood, normal affect    Chest imaging: July 2023 CT abdomen lung windows independently reviewed showing normal pulmonary parenchyma, no fibrotic or other change, only bases of the lungs visible November 2023 two-view chest x-ray interpreted as normal without pulmonary parenchymal abnormality, neurostimulator noticed with leads in thoracic spine  CT Chest 03/19/23 - No evidence of ILD. Fine centrilobular and ground glass nodularity in bases.  CT Chest 01/06/24 - Resolving GGO in lung bases  PFT: 12/17/23 FVC 3.46 (105%) FEV1 2.91 (115%) Ratio 84  TLC 110% DLCO 119% Interpretation: No obstructive or restrictive defect. Borderline elevated gas exchange suggestive of asthma. Clinically correlate   Labs: CBC No results found for: "WBC", "RBC", "HGB", "HCT", "PLT", "MCV", "MCH", "MCHC", "RDW", "LYMPHSABS", "MONOABS", "EOSABS", "BASOSABS"  AFB 01/21/23 - neg    Assessment & Plan:   Chronic bronchitis, unspecified chronic bronchitis type (HCC)  Asthmatic bronchitis without complication, unspecified asthma severity, unspecified whether  persistent  Discussion: 68 year old female with RA on methotrexate who presents for follow-up for asthmatic bronchitis. Not improved on Breztri or Trelegy. Flutter valve has helped. 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 REFILL --Reviewed CT Chest. Improved --Reviewed pulmonary function test. Normal with high-normal DLCO. Clinical picture consistent with asthmatic bronchitis  Allergic rhinitis: --Use NeilMed rinses over-the-counter twice daily to each nostril --Continue taking Allegra --Does not want to take Flonase or any nasal steroid due to history of epistaxis in the past  I have spent a total time of 32-minutes on the day of the appointment including chart review, data review, collecting history, coordinating care and discussing medical diagnosis and plan with the patient/family. Past medical history, allergies, medications were reviewed. Pertinent imaging, labs and tests included in this note have been reviewed and interpreted independently by me.  Mechele Collin, MD [facility] 01/20/2024 3:43 PM     Immunizations: Immunization History  Administered Date(s) Administered   Hepatitis B, PED/ADOLESCENT 03/26/2006, 04/26/2006, 10/28/2006   Influenza Split 10/23/2015   Influenza, Quadrivalent, Recombinant, Inj, Pf 09/09/2017   Influenza,inj,Quad PF,6+ Mos 09/25/2016, 08/31/2020, 09/18/2021, 10/02/2022   Influenza-Unspecified 08/16/2018, 08/01/2019, 07/26/2023   PFIZER Comirnaty(Gray Top)Covid-19 Tri-Sucrose Vaccine 03/25/2020, 01/14/2021   PFIZER(Purple Top)SARS-COV-2 Vaccination 03/01/2020, 03/25/2020   Pneumococcal  Polysaccharide-23 08/19/2006   Td (Adult),5 Lf Tetanus Toxid, Preservative Free 03/26/2006   Tdap 05/11/2016   Zoster Recombinant(Shingrix) 11/27/2019, 01/29/2020     Current Outpatient Medications:    b complex vitamins capsule, Take 1 capsule by mouth daily., Disp: , Rfl:    budesonide (PULMICORT) 0.25 MG/2ML nebulizer solution, Take 2 mLs (0.25 mg total) by nebulization 2 (two) times daily., Disp: 120 mL, Rfl: 5   busPIRone (BUSPAR) 30 MG tablet, Take 30 mg by mouth 2 (two) times daily. , Disp: , Rfl:    Cholecalciferol (VITAMIN D3) 5000 units CAPS, Take 2,000 Units by mouth., Disp: , Rfl:    DULoxetine (CYMBALTA) 30 MG capsule, Take 30 mg by mouth 2 (two) times daily., Disp: , Rfl:    DULoxetine (CYMBALTA) 60 MG capsule, Take 60 mg by mouth 2 (two) times daily., Disp: , Rfl:    fenofibrate 54 MG tablet, Take 27 mg by mouth daily., Disp: , Rfl:    fexofenadine (ALLEGRA) 180 MG tablet, Take 180 mg by mouth daily., Disp: , Rfl:    Fluticasone-Umeclidin-Vilant (TRELEGY ELLIPTA) 200-62.5-25 MCG/ACT AEPB, Inhale 1 puff into the lungs daily., Disp: 60 each, Rfl: 11   folic acid (FOLVITE) 1 MG tablet, Take 1 mg by mouth daily. , Disp: , Rfl:    golimumab (SIMPONI ARIA) 50 MG/4ML SOLN injection, Inject 50 mg into the vein now. Takes every other month, Disp: , Rfl:    hydrochlorothiazide (HYDRODIURIL) 25 MG tablet, Take 25 mg by mouth daily. , Disp: , Rfl:    losartan (COZAAR) 25 MG tablet, Take 25 mg by mouth daily., Disp: , Rfl:    mirtazapine (REMERON) 15 MG tablet, Take 15 mg by mouth at bedtime., Disp: , Rfl:    montelukast (SINGULAIR) 10 MG tablet, Take 10 mg by mouth daily., Disp: , Rfl:    Multiple Vitamin (MULTIVITAMIN) capsule, Take 1 capsule by mouth daily., Disp: , Rfl:    potassium chloride (K-DUR,KLOR-CON) 10 MEQ tablet, Take 10 mEq by mouth., Disp: , Rfl:    pregabalin (LYRICA) 100 MG capsule, Take 1 capsule (100 mg total) by mouth 3 (three) times daily., Disp: 270 capsule, Rfl: 0    Probiotic Product (PROBIOTIC DAILY) CAPS, Take by  mouth., Disp: , Rfl:    rizatriptan (MAXALT-MLT) 10 MG disintegrating tablet, Take 1 tablet by mouth at the onset of migraine, may repeat in 2 hours. max of 2 doses per day, Disp: 18 tablet, Rfl: 0   rosuvastatin (CRESTOR) 20 MG tablet, Take 20 mg by mouth at bedtime., Disp: , Rfl:    sertraline (ZOLOFT) 100 MG tablet, Take 200 mg by mouth daily. , Disp: , Rfl:    topiramate (TOPAMAX) 100 MG tablet, Take 100 mg by mouth 2 (two) times daily., Disp: , Rfl:

## 2024-01-20 NOTE — Patient Instructions (Signed)
  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 Improved --Reviewed pulmonary function test. Normal with high-normal DLCO. Clinical picture consistent with asthmatic bronchitis

## 2024-04-28 ENCOUNTER — Telehealth: Payer: Self-pay

## 2024-04-28 DIAGNOSIS — J45909 Unspecified asthma, uncomplicated: Secondary | ICD-10-CM

## 2024-04-28 DIAGNOSIS — R053 Chronic cough: Secondary | ICD-10-CM

## 2024-04-28 NOTE — Telephone Encounter (Signed)
 Copied from CRM [id number]. Topic: Clinical - Order For Equipment >> Apr 27, 2024  3:14 PM [name] wrote: Reason for CRM:  Patient needs a flutter valve replaced. Order, clinical notes, and current copy of ins card. [employer] phone [phone]   FAX [phone]  Please call patient if you there is an issue:  [phone]  [name] can I send in a order for patient to get a new flutter valve replaced .

## 2024-05-03 NOTE — Telephone Encounter (Signed)
 Yes

## 2024-05-05 NOTE — Telephone Encounter (Signed)
 Spoke with patient regarding Order has been placed for a flutter valve.  Patient's voice was understanding.Nothing else further needed.

## 2024-05-19 ENCOUNTER — Ambulatory Visit (HOSPITAL_BASED_OUTPATIENT_CLINIC_OR_DEPARTMENT_OTHER): Payer: Self-pay | Admitting: Pulmonary Disease

## 2024-05-19 ENCOUNTER — Telehealth: Payer: Self-pay

## 2024-05-19 NOTE — Telephone Encounter (Signed)
 FYI Only or Action Required?: Action required by provider: request for appointment and patient is going to the ED.  Patient is followed in Pulmonology for chronic bronchitis, last seen on 01/20/2024 by Deborah Acquanetta Bradley, MD. Called Nurse Triage reporting blood tinged sputum. Symptoms began several days ago. Interventions attempted: Rescue inhaler, Maintenance inhaler, Nebulizer treatments, and Increased fluids/rest. Symptoms are: gradually worsening.  Triage Disposition: See HCP Within 4 Hours (Or PCP Triage)-patient to ED  Patient/caregiver understands and will follow disposition?: Yes  Copied from CRM [id number]. Topic: Clinical - Red Word Triage >> May 19, 2024  2:20 PM [name] wrote: Red Word that prompted transfer to Nurse Triage: Thinks spit is blood-tinged over several days. Reason for Disposition  [1] Coughed up blood AND [2] > 1 tablespoon (15 ml)   (Exception: Blood-tinged sputum.)  Answer Assessment - Initial Assessment Questions 1. ONSET: When did the cough begin?      Started 20 months ago 2. SEVERITY: How bad is the cough today?      1 3. SPUTUM: Describe the color of your sputum (none, dry cough; clear, white, yellow, green)     pink 4. HEMOPTYSIS: Are you coughing up any blood? If so ask: How much? (flecks, streaks, tablespoons, etc.)     Pink sputum 5. DIFFICULTY BREATHING: Are you having difficulty breathing? If Yes, ask: How bad is it? (e.g., mild, moderate, severe)    - MILD: No SOB at rest, mild SOB with walking, speaks normally in sentences, can lie down, no retractions, pulse < 100.    - MODERATE: SOB at rest, SOB with minimal exertion and prefers to sit, cannot lie down flat, speaks in phrases, mild retractions, audible wheezing, pulse 100-120.    - SEVERE: Very SOB at rest, speaks in single words, struggling to breathe, sitting hunched forward, retractions, pulse > 120      Mild 6. FEVER: Do you have a fever? If Yes, ask: What is your temperature,  how was it measured, and when did it start?     no 7. CARDIAC HISTORY: Do you have any history of heart disease? (e.g., heart attack, congestive heart failure)      Aneurysm, leaky valve 8. LUNG HISTORY: Do you have any history of lung disease?  (e.g., pulmonary embolus, asthma, emphysema)     Chronic bronchitis 9. PE RISK FACTORS: Do you have a history of blood clots? (or: recent major surgery, recent prolonged travel, bedridden)     no 10. OTHER SYMPTOMS: Do you have any other symptoms? (e.g., runny nose, wheezing, chest pain)       Runny nose 12. TRAVEL: Have you traveled out of the country in the last month? (e.g., travel history, exposures)       No  Patient endorses not being able to check her oxygen saturation as she doesn't have a pulse oximeter. Patient reports coughing up large globs of pink sputum. Patient states this started several days ago. Patient recommended to the ED to be evaluated.  Protocols used: Cough - Chronic-A-AH

## 2024-05-19 NOTE — Telephone Encounter (Signed)
 Copied from CRM [id number]. Topic: Clinical - Order For Equipment >> May 19, 2024  2:12 PM [name] wrote: Reason for CRM: Patient states she needed the complete instrument with the flutter inside of it - what she received is useless. Patient requesting to speak with Consuelo.   Callback number: [phone]   LM with patient to return call  so we can get Sharp little more understanding about the issues with the flutter it does come with instructions

## 2024-05-22 NOTE — Telephone Encounter (Signed)
 I have not had time to call this patient yet

## 2024-05-22 NOTE — Telephone Encounter (Signed)
 What do we have for ED Followup if this is what pt is requesting?

## 2024-06-22 ENCOUNTER — Ambulatory Visit (INDEPENDENT_AMBULATORY_CARE_PROVIDER_SITE_OTHER): Admitting: Nurse Practitioner

## 2024-06-22 ENCOUNTER — Ambulatory Visit (HOSPITAL_BASED_OUTPATIENT_CLINIC_OR_DEPARTMENT_OTHER)

## 2024-06-22 ENCOUNTER — Encounter (HOSPITAL_BASED_OUTPATIENT_CLINIC_OR_DEPARTMENT_OTHER): Payer: Self-pay | Admitting: Nurse Practitioner

## 2024-06-22 VITALS — BP 135/76 | HR 73 | Ht 65.0 in | Wt 185.0 lb

## 2024-06-22 DIAGNOSIS — J4540 Moderate persistent asthma, uncomplicated: Secondary | ICD-10-CM

## 2024-06-22 DIAGNOSIS — J45998 Other asthma: Secondary | ICD-10-CM | POA: Diagnosis not present

## 2024-06-22 DIAGNOSIS — J329 Chronic sinusitis, unspecified: Secondary | ICD-10-CM

## 2024-06-22 DIAGNOSIS — J45909 Unspecified asthma, uncomplicated: Secondary | ICD-10-CM | POA: Insufficient documentation

## 2024-06-22 DIAGNOSIS — R058 Other specified cough: Secondary | ICD-10-CM

## 2024-06-22 DIAGNOSIS — M069 Rheumatoid arthritis, unspecified: Secondary | ICD-10-CM | POA: Diagnosis not present

## 2024-06-22 DIAGNOSIS — J321 Chronic frontal sinusitis: Secondary | ICD-10-CM

## 2024-06-22 NOTE — Progress Notes (Signed)
 @Patient  ID: [name], female    DOB: 06/15/1956, 68 y.o.   MRN: [id number]  Chief Complaint  Patient presents with   Acute Visit    Referring provider: Corlis Pagan, NP  HPI: 68 year old female, never smoker followed for asthmatic bronchitis. She is a patient of Dr. Laymond and last seen in office 01/20/2024. Past medical history significant for RA on methotrexate, memory deficit, migraines, PTSD, anxiety, depression.   TEST/EVENTS:  05/2022 CT abd: lung windows with normal parenchyma 09/2022 CXR: normal lungs, clear 03/19/2023 CT chest: no ILD. Fine centrilobular and ground glass nodularity in bases 12/17/2023 PFT: FVC 105, FEV1 115, ratio 84, TLC 110, DLCO 119 01/06/2024 CT chest: resolving GGO in bases  01/20/2024: OV with Dr. Kassie for follow up. Mucous production has increased. Using flutter valve. Breathing improves when mucous comes up. Frustrated by continued mucous production. Has not started budesonide  nebs. Recurrent bronchitis in immunocompromised host; start budesonide  nebs and encouraged flutter valve. Tried breo and Brezti - ineffective. Continue Trelegy 200 mcg 1 puff daily. PFT normal with high normal DLCO. Clinical picture consistent with asthmatic bronchitis. Encouraged to use NeilMed rinses. Does not want to use flonase given hx of epistaxis.   06/22/2024: Today - follow up Discussed the use of AI scribe software for clinical note transcription with the patient, who gave verbal consent to proceed.  History of Present Illness [name] is a 68 year old female who presents for follow-up. She is accompanied by her husband.  She initially made this appt due to increased productive cough with some occasional pink tinged sputum. She changed her flutter valve and has started using it more consistently. She couldn't figure out how to clean the other one. She's now feeling better. Cough has improved. She still produces some white/cream mucous but overall, it  has decreased. No pink tinge over the last few weeks. She did take a z pack around 3 weeks ago, which she thinks helped also. No steroids. She denies any fevers, mass hemoptysis, dyspnea, wheezing, leg swelling, orthopnea, CP. Eating and drinking well. No weight loss or anorexia.   She uses Trelegy daily. She's not really using her nebulizer treatments. She has not been using her rescue inhaler, albuterol, and continues to take Allegra, Singulair, and guaifenesin 1200 mg twice a day.  She has two grandsons who may have contributed to her symptoms. No viral testing.   She experiences sinus fullness and uses a sinus allergy medication containing chlorpheniramine and phenylephrine in tablet form. She does not use nasal sprays. Not doing any saline rinses.     Allergies  Allergen Reactions   Asa [Aspirin] Shortness Of Breath and Swelling    Wheezing and throat swells   Ibuprofen Other (See Comments) and Swelling    Wheezing and throat swells Wheezing and throat swells   Fluticasone  Other (See Comments)    makes my nose bleed    Immunization History  Administered Date(s) Administered   Hepatitis B, PED/ADOLESCENT 03/26/2006, 04/26/2006, 10/28/2006   Influenza Split 10/23/2015   Influenza, Quadrivalent, Recombinant, Inj, Pf 09/09/2017   Influenza,inj,Quad PF,6+ Mos 09/25/2016, 08/31/2020, 09/18/2021, 10/02/2022   Influenza-Unspecified 08/16/2018, 08/01/2019, 07/26/2023   PFIZER Comirnaty(Gray Top)Covid-19 Tri-Sucrose Vaccine 03/25/2020, 01/14/2021   PFIZER(Purple Top)SARS-COV-2 Vaccination 03/01/2020, 03/25/2020   Pneumococcal Polysaccharide-23 08/19/2006   Td (Adult),5 Lf Tetanus Toxid, Preservative Free 03/26/2006   Tdap 05/11/2016   Zoster Recombinant(Shingrix) 11/27/2019, 01/29/2020    Past Medical History:  Diagnosis Date   Anxiety and depression  Cervical spondylosis with myelopathy    Chronic interstitial cystitis    Diplopia    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)   Memory deficit 12/27/2013   Memory disorder    Memory loss    Migraine without aura, without mention of intractable migraine without mention of status migrainosus    Myalgia and myositis, unspecified    Myalgia and myositis, unspecified    Other malaise and fatigue    PTSD (post-traumatic stress disorder)    Rheumatoid arthritis(714.0)     Tobacco History: Social History   Tobacco Use  Smoking Status Never  Smokeless Tobacco Never   Counseling given: Not Answered   Outpatient Medications Prior to Visit  Medication Sig Dispense Refill   b complex vitamins capsule Take 1 capsule by mouth daily.     busPIRone (BUSPAR) 30 MG tablet Take 30 mg by mouth 2 (two) times daily.      Cholecalciferol (VITAMIN D3) 5000 units CAPS Take 2,000 Units by mouth.     DULoxetine (CYMBALTA) 30 MG capsule Take 30 mg by mouth 2 (two) times daily.     DULoxetine (CYMBALTA) 60 MG capsule Take 60 mg by mouth 2 (two) times daily.     fenofibrate 54 MG tablet Take 27 mg by mouth daily.     fexofenadine (ALLEGRA) 180 MG tablet Take 180 mg by mouth daily.     Fluticasone -Umeclidin-Vilant (TRELEGY ELLIPTA ) 200-62.5-25 MCG/ACT AEPB Inhale 1 puff into the lungs daily. 60 each 11   folic acid (FOLVITE) 1 MG tablet Take 1 mg by mouth daily.      golimumab (SIMPONI ARIA) 50 MG/4ML SOLN injection Inject 50 mg into the vein now. Takes every other month     hydrochlorothiazide (HYDRODIURIL) 25 MG tablet Take 25 mg by mouth daily.      losartan (COZAAR) 25 MG tablet Take 25 mg by mouth daily.     methotrexate (RHEUMATREX) 2.5 MG tablet Take 2.5 mg by mouth once a week. Caution:Chemotherapy. Protect from light. 6 tablets weekly     mirtazapine (REMERON) 15 MG tablet Take 15 mg by mouth at bedtime.     montelukast (SINGULAIR) 10 MG tablet Take 10 mg by mouth daily.     Multiple Vitamin (MULTIVITAMIN) capsule Take 1 capsule by mouth daily.     potassium chloride (K-DUR,KLOR-CON) 10 MEQ tablet Take 10 mEq  by mouth.     pregabalin  (LYRICA ) 100 MG capsule Take 1 capsule (100 mg total) by mouth 3 (three) times daily. 270 capsule 0   Probiotic Product (PROBIOTIC DAILY) CAPS Take by mouth.     rizatriptan  (MAXALT -MLT) 10 MG disintegrating tablet Take 1 tablet by mouth at the onset of migraine, may repeat in 2 hours. max of 2 doses per day 18 tablet 0   rosuvastatin (CRESTOR) 20 MG tablet Take 20 mg by mouth at bedtime.     sertraline (ZOLOFT) 100 MG tablet Take 200 mg by mouth daily.      topiramate  (TOPAMAX ) 100 MG tablet Take 100 mg by mouth 2 (two) times daily.     budesonide  (PULMICORT ) 0.25 MG/2ML nebulizer solution Take 2 mLs (0.25 mg total) by nebulization 2 (two) times daily. (Patient not taking: Reported on 06/22/2024) 120 mL 5   No facility-administered medications prior to visit.     Review of Systems:   Constitutional: No weight loss or gain, night sweats, fevers, chills, fatigue, or lassitude. HEENT: No headaches, difficulty swallowing, tooth/dental problems, or sore  throat. No sneezing, itching, ear ache +nasal congestion, post nasal drip CV:  No chest pain, orthopnea, PND, swelling in lower extremities, anasarca, dizziness, palpitations, syncope Resp: +improved productive cough. No shortness of breath with exertion or at rest. No excess mucus or change in color of mucus. No hemoptysis. No wheezing.  No chest wall deformity GI:  No heartburn, indigestion, loss of appetite Skin: No rash, lesions, ulcerations Neuro: +baseline mild memory impairment  Psych: No depression or anxiety. Mood stable.     Physical Exam:  BP 135/76 (BP Location: Left Arm, Patient Position: Sitting)   Pulse 73   Ht 5' 5 (1.651 m)   Wt 185 lb (83.9 kg)   SpO2 94%   BMI 30.79 kg/m   GEN: Pleasant, interactive, well-appearing; obese; in no acute distress HEENT:  Normocephalic and atraumatic. PERRLA. Sclera white. Nasal turbinates erythematous, moist and patent bilaterally. No rhinorrhea present.  Oropharynx pink and moist, without exudate or edema. No lesions, ulcerations, or postnasal drip.  NECK:  Supple w/ fair ROM. No JVD present. No lymphadenopathy.   CV: RRR, no m/r/g, no peripheral edema. Pulses intact, +2 bilaterally. No cyanosis, pallor or clubbing. PULMONARY:  Unlabored, regular breathing. Clear bilaterally A&P w/o wheezes/rales/rhonchi. No accessory muscle use.  GI: BS present and normoactive. Soft, non-tender to palpation. No organomegaly or masses detected.  MSK: No erythema, warmth or tenderness. Cap refil <2 sec all extrem.  Neuro: A/Ox3. No focal deficits noted.   Skin: Warm, no lesions or rashe Psych: Normal affect and behavior. Judgement and thought content appropriate.     Lab Results:  CBC No results found for: WBC, RBC, HGB, HCT, PLT, MCV, MCH, MCHC, RDW, LYMPHSABS, MONOABS, EOSABS, BASOSABS  BMET No results found for: NA, K, CL, CO2, GLUCOSE, BUN, CREATININE, CALCIUM, GFRNONAA, GFRAA  BNP No results found for: BNP   Imaging:  No results found.  Administration History     None          Latest Ref Rng & Units 12/17/2023   11:24 AM  PFT Results  FVC-Pre L 3.48   FVC-Predicted Pre % 105   FVC-Post L 3.46   FVC-Predicted Post % 105   Pre FEV1/FVC % % 85   Post FEV1/FCV % % 84   FEV1-Pre L 2.95   FEV1-Predicted Pre % 117   FEV1-Post L 2.91   DLCO uncorrected ml/min/mmHg 24.45   DLCO UNC% % 119   DLCO corrected ml/min/mmHg 24.45   DLCO COR %Predicted % 119   DLVA Predicted % 111   TLC L 5.75   TLC % Predicted % 110   RV % Predicted % 96     No results found for: NITRICOXIDE      Assessment & Plan:   Asthmatic bronchitis without complication Chronic asthmatic bronchitis with recent exacerbation. Now resolved. Appears clinically improved following empiric azithromycin and mucociliary clearance. Lung exam clear. Encouraged her to continue with mucociliary clearance and  bronchodilator regimen. Re-educated on use of nebulized treatments. Suspect a component of her cough is related to postnasal drainage. Advised she add on saline nasal rinses. Will check CXR today, given hx of pink tinged sputum (now resolved), to rule out superimposed infection/edema. Strict return precautions. Action plan in place.  Patient Instructions  Continue Trelegy 1 puff daily. Brush tongue and rinse mouth afterwards Continue Albuterol inhaler 2 puffs or 3 mL neb every 6 hours as needed for shortness of breath or wheezing. Notify if symptoms persist despite rescue inhaler/neb use. Continue allegra  1 tab daily for allergies Continue montelukast (singulair) 1 tab daily  Continue budesonide  2 mL neb Twice daily as needed for shortness of breath, wheezing, cough  Use saline nasal rinses 1-2 times a day with bottled, distilled water  Ok to continue over the counter allergy pill   Chest x ray today  Follow up in 4 months with Dr. Kassie. If symptoms do not improve or worsen, please contact office for sooner follow up or seek emergency care.    Chronic sinusitis See above. Continue allergy regimen  Rheumatoid arthritis (HCC) On methotrexate. Followed by rheumatology. No evidence of RA ILD on prior imaging. No restriction on PFTs or diffusion defect. Continue to monitor.    Advised if symptoms do not improve or worsen, to please contact office for sooner follow up or seek emergency care.   I spent 35 minutes of dedicated to the care of this patient on the date of this encounter to include pre-visit review of records, face-to-face time with the patient discussing conditions above, post visit ordering of testing, clinical documentation with the electronic health record, making appropriate referrals as documented, and communicating necessary findings to members of the patients care team.  Comer LULLA Rouleau, NP 06/22/2024  Pt aware and understands NP's role.

## 2024-06-22 NOTE — Assessment & Plan Note (Signed)
 Chronic asthmatic bronchitis with recent exacerbation. Now resolved. Appears clinically improved following empiric azithromycin and mucociliary clearance. Lung exam clear. Encouraged her to continue with mucociliary clearance and bronchodilator regimen. Re-educated on use of nebulized treatments. Suspect a component of her cough is related to postnasal drainage. Advised she add on saline nasal rinses. Will check CXR today, given hx of pink tinged sputum (now resolved), to rule out superimposed infection/edema. Strict return precautions. Action plan in place.  Patient Instructions  Continue Trelegy 1 puff daily. Brush tongue and rinse mouth afterwards Continue Albuterol inhaler 2 puffs or 3 mL neb every 6 hours as needed for shortness of breath or wheezing. Notify if symptoms persist despite rescue inhaler/neb use. Continue allegra 1 tab daily for allergies Continue montelukast (singulair) 1 tab daily  Continue budesonide  2 mL neb Twice daily as needed for shortness of breath, wheezing, cough  Use saline nasal rinses 1-2 times a day with bottled, distilled water  Ok to continue over the counter allergy pill   Chest x ray today  Follow up in 4 months with Dr. Kassie. If symptoms do not improve or worsen, please contact office for sooner follow up or seek emergency care.

## 2024-06-22 NOTE — Assessment & Plan Note (Signed)
 On methotrexate. Followed by rheumatology. No evidence of RA ILD on prior imaging. No restriction on PFTs or diffusion defect. Continue to monitor.

## 2024-06-22 NOTE — Assessment & Plan Note (Signed)
 See above. Continue allergy regimen

## 2024-06-22 NOTE — Patient Instructions (Addendum)
 Continue Trelegy 1 puff daily. Brush tongue and rinse mouth afterwards Continue Albuterol inhaler 2 puffs or 3 mL neb every 6 hours as needed for shortness of breath or wheezing. Notify if symptoms persist despite rescue inhaler/neb use. Continue allegra 1 tab daily for allergies Continue montelukast (singulair) 1 tab daily  Continue budesonide  2 mL neb Twice daily as needed for shortness of breath, wheezing, cough  Use saline nasal rinses 1-2 times a day with bottled, distilled water  Ok to continue over the counter allergy pill   Chest x ray today  Follow up in 4 months with Dr. Kassie. If symptoms do not improve or worsen, please contact office for sooner follow up or seek emergency care.

## 2024-06-29 ENCOUNTER — Ambulatory Visit: Payer: Self-pay | Admitting: Nurse Practitioner

## 2024-06-29 NOTE — Progress Notes (Signed)
 CXR clear

## 2024-08-01 ENCOUNTER — Ambulatory Visit

## 2024-08-01 ENCOUNTER — Encounter: Payer: Self-pay | Admitting: Internal Medicine

## 2024-08-01 ENCOUNTER — Ambulatory Visit (HOSPITAL_BASED_OUTPATIENT_CLINIC_OR_DEPARTMENT_OTHER): Payer: Self-pay | Admitting: Pulmonary Disease

## 2024-08-01 ENCOUNTER — Ambulatory Visit (INDEPENDENT_AMBULATORY_CARE_PROVIDER_SITE_OTHER): Admitting: Internal Medicine

## 2024-08-01 VITALS — BP 158/73 | HR 90 | Temp 98.5°F | Ht 65.0 in | Wt 192.8 lb

## 2024-08-01 DIAGNOSIS — R040 Epistaxis: Secondary | ICD-10-CM

## 2024-08-01 DIAGNOSIS — R042 Hemoptysis: Secondary | ICD-10-CM | POA: Diagnosis not present

## 2024-08-01 DIAGNOSIS — J4540 Moderate persistent asthma, uncomplicated: Secondary | ICD-10-CM

## 2024-08-01 DIAGNOSIS — M069 Rheumatoid arthritis, unspecified: Secondary | ICD-10-CM | POA: Diagnosis not present

## 2024-08-01 NOTE — Progress Notes (Signed)
 [name]    [id number]    1956-03-06  Primary Care Physician:Tate, Izetta, NP Date of Appointment: 08/01/2024 Established Patient Visit  Chief complaint:   Chief Complaint  Patient presents with   Medical Management of Chronic Issues    Coughing blood since this morning      HPI: [name] is 68 y.o. woman with past medical history of asthma. And epistaxis.   Interval Updates: Discussed the use of AI scribe software for clinical note transcription with the patient, who gave verbal consent to proceed.  History of Present Illness [name] is a 68 year old female with rheumatoid arthritis and asthma who presents with hemoptysis. She is accompanied by her husband.  She experienced her first episode of coughing up frank blood this morning at 9:00 AM, having previously only had blood-tinged sputum. She believes the blood originated from her lungs and not from a nosebleed, as she feels she can differentiate between the two. The amount of blood was enough to be visible on a tissue.  She has a history of epistaxis, with a recent episode last week that required cauterization. Despite this, she experienced recurrent epistaxis in the last few days. However, she believes that the blood she coughed up was not related to her epistaxis.  She experienced severe nerve pain on the side with the ribs last night, persisting for a week. She reports chest pain and nerve pain, but did not specifically mention worsening of breathing, coughing, chest tightness, or wheezing. No fevers, chills, night sweats, or weight loss, although she mentions a decreased appetite due to a recent cold.  Her asthma is managed with a Trelegy inhaler, one puff once a day, and she has a rescue inhaler and nebulizer therapy, which she does not frequently use. She notes increased and thicker mucus production over the past two years.  Her rheumatoid arthritis is managed with methotrexate, and  her RA symptoms are currently stable.    I have reviewed the patient's family social and past medical history and updated as appropriate.   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)   Memory deficit 12/27/2013   Memory disorder    Memory loss    Migraine without aura, without mention of intractable migraine without mention of status migrainosus    Myalgia and myositis, unspecified    Myalgia and myositis, unspecified    Other malaise and fatigue    PTSD (post-traumatic stress disorder)    Rheumatoid arthritis(714.0)     Past Surgical History:  Procedure Laterality Date   ABDOMINAL HYSTERECTOMY     CESAREAN SECTION     LUMBAR LAMINECTOMY Left 1993   small bowel obstruction  11/22/2018    Family History  Problem Relation Age of Onset   Heart failure Father    Hypertension Mother     Social History   Occupational History   Not on file  Tobacco Use   Smoking status: Never   Smokeless tobacco: Never  Substance and Sexual Activity   Alcohol use: No    Alcohol/week: 0.0 standard drinks of alcohol   Drug use: No   Sexual activity: Not on file     Physical Exam: Blood pressure (!) 158/73, pulse 90, temperature 98.5 F (36.9  C), temperature source Temporal, height 5' 5 (1.651 m), weight 192 lb 12.8 oz (87.5 kg), SpO2 96%.  Gen:      No acute distress ENT:  no blood in nose or oropharynx, no nasal polyps, mucus membranes moist Lungs:    No increased respiratory effort, symmetric chest wall excursion, clear to auscultation bilaterally, no wheezes or crackles CV:         Regular rate and rhythm; no murmurs, rubs, or gallops.  No pedal edema   Data Reviewed: Imaging: I have personally reviewed the feb 2025 high res ct chest, no fibrosis or ild.   PFTs:     Latest Ref Rng & Units 12/17/2023   11:24 AM  PFT  Results  FVC-Pre L 3.48   FVC-Predicted Pre % 105   FVC-Post L 3.46   FVC-Predicted Post % 105   Pre FEV1/FVC % % 85   Post FEV1/FCV % % 84   FEV1-Pre L 2.95   FEV1-Predicted Pre % 117   FEV1-Post L 2.91   DLCO uncorrected ml/min/mmHg 24.45   DLCO UNC% % 119   DLCO corrected ml/min/mmHg 24.45   DLCO COR %Predicted % 119   DLVA Predicted % 111   TLC L 5.75   TLC % Predicted % 110   RV % Predicted % 96    I have personally reviewed the patient's PFTs and normal pulmonary function  Labs:  Immunization status: Immunization History  Administered Date(s) Administered   Hepatitis B, PED/ADOLESCENT 03/26/2006, 04/26/2006, 10/28/2006   Influenza Split 10/23/2015   Influenza, Quadrivalent, Recombinant, Inj, Pf 09/09/2017   Influenza,inj,Quad PF,6+ Mos 09/25/2016, 08/31/2020, 09/18/2021, 10/02/2022   Influenza-Unspecified 08/16/2018, 08/01/2019, 07/26/2023   PFIZER Comirnaty(Gray Top)Covid-19 Tri-Sucrose Vaccine 03/25/2020, 01/14/2021   PFIZER(Purple Top)SARS-COV-2 Vaccination 03/01/2020, 03/25/2020   Pneumococcal Polysaccharide-23 08/19/2006   Td (Adult),5 Lf Tetanus Toxid, Preservative Free 03/26/2006   Tdap 05/11/2016   Zoster Recombinant(Shingrix) 11/27/2019, 01/29/2020    External Records Personally Reviewed: pulmonary  Assessment and Plan Assessment & Plan Hemoptysis First episode of frank hemoptysis with scant blood. No systemic symptoms or increased respiratory issues. Differential includes benign causes; CT scan preferred due to rheumatoid arthritis. - Order CT scan of the chest to evaluate for acute pathology. - Advise emergency care if hemoptysis increases to spoon or cup amount. - Instruct to call office if hemoptysis persists or increases for further triage.  Asthma Asthma managed with Trelegy inhaler and infrequent rescue therapy. Reports chest pain and mucus production not attributed to asthma. Await CT scan for further evaluation. - Continue Trelegy inhaler  once daily.    Return to Care: Follow up with Dr. Kassie next week.   Verdon Gore, MD Pulmonary and Critical Care Medicine [facility] Office:[phone]

## 2024-08-01 NOTE — Telephone Encounter (Signed)
 FYI- Pt has appt at 1:15 today

## 2024-08-01 NOTE — Telephone Encounter (Signed)
 FYI Only or Action Required?: FYI only for provider.  Patient is followed in Pulmonology for lung nodules, last seen on 06/22/2024 by Deborah Comer [name], NP.  Called Nurse Triage reporting Chest Pain, Hemoptysis, and Back Pain.  Symptoms began today.  Interventions attempted: OTC medications: Afrin and Sudafed and Maintenance inhaler.  Symptoms are: stable.  Triage Disposition: See HCP Within 4 Hours (Or PCP Triage)  Patient/caregiver understands and will follow disposition?: Yes                             Copied from CRM #[id number]. Topic: Clinical - Red Word Triage >> Aug 01, 2024 11:27 AM [name] wrote: Red Word that prompted transfer to Nurse Triage: pt stated coughing of blood size of half dollar, nerve pain on left side of ribs. Reason for Disposition  [1] Coughed up blood AND [2] > 1 tablespoon (15 ml)  Answer Assessment - Initial Assessment Questions E2C2 Pulmonary Triage - Initial Assessment Questions  1. Chief Complaint (e.g., cough, sob, wheezing, fever, chills, sweat or additional symptoms) *Go to specific symptom protocol after initial questions. Coughing up blood   2. How long have symptoms been present? Today   3. Have you used any OTC meds to help with symptoms? If yes, ask What medications? Afrin and Sudafed   4. Have you used your inhalers/maintenance medication? If yes, What medications? Trelegy 1 x day, states she has not used albuterol inhaler   5. Do you wear supplemental oxygen? If yes, How many liters are you supposed to use? Denies   6. Do you monitor your oxygen levels? If yes, What is your reading (oxygen level) today? Denies    1. ONSET: When did the cough begin?      This morning  2. SEVERITY: How bad is the cough today? Did the blood appear after a coughing spell?      States she is coughing about every 2 hours 3. SPUTUM: Describe the color of your sputum (e.g., none, dry cough; clear,  white, yellow, green)     Clear 4. HEMOPTYSIS: How much blood? (e.g., flecks, streaks, tablespoons)     1/2 dollar size 5. DIFFICULTY BREATHING: Are you having difficulty breathing? If Yes, ask: How bad is it? (e.g., mild, moderate, severe)      States she experienced difficulty breathing last night, denies difficulty breathing at this current time, patient able to speak in clear and complete sentences while on phone with this RN  6. FEVER: Do you have a fever? If Yes, ask: What is your temperature, how was it measured, and when did it start?     Denies 7. CARDIAC HISTORY: Do you have any history of heart disease? (e.g., heart attack, congestive heart failure)      Atherosclerosis, aneurysm  8. LUNG HISTORY: Do you have any history of lung disease?  (e.g., pulmonary embolus, asthma, emphysema)     Nodules 9. PE RISK FACTORS: Do you have a history of blood clots? Note: Other risk factors include recent major surgery, recent prolonged travel, being bedridden.     Denies 10. OTHER SYMPTOMS: Do you have any other symptoms? (e.g., runny nose, wheezing, chest pain)     Intercostal pain along left side- states pain has improved, mild dizziness upon standing, nose bleed, HR 76 while on phone with this RN, denies pain when breathing, denies chest pain  Protocols used: Coughing Up Blood-A-AH

## 2024-08-01 NOTE — Patient Instructions (Addendum)
 It was a pleasure to see you today! Please keep your appointment with Dr Kassie next week.   VISIT SUMMARY:  Today, you came in because you experienced coughing up blood for the first time this morning. You have a history of nosebleeds, but you believe this blood came from your lungs. You also mentioned having chest pain and nerve pain on your side, but no worsening of your asthma symptoms. Your asthma and rheumatoid arthritis are currently stable with your current medications.  YOUR PLAN:  -HEMOPTYSIS: Hemoptysis means coughing up blood from the lungs. We will order a CT scan of your chest to check for any serious issues. If you start coughing up larger amounts of blood, please go to the emergency room immediately. If the coughing up of blood continues or gets worse, call our office for further instructions.  -ASTHMA: Asthma is a condition where your airways narrow and swell, making it hard to breathe. Your asthma is currently managed with your Trelegy inhaler, which you should continue using once daily. We will wait for the results of your CT scan to see if there are any other concerns related to your chest pain and mucus production.  INSTRUCTIONS:  Please go to the emergency room if you start coughing up larger amounts of blood. Call our office if the coughing up of blood continues or gets worse. Continue using your Trelegy inhaler once daily as prescribed. We will follow up with you after your CT scan results are available.

## 2024-08-03 ENCOUNTER — Ambulatory Visit: Payer: Self-pay | Admitting: Internal Medicine

## 2024-08-03 ENCOUNTER — Ambulatory Visit (HOSPITAL_BASED_OUTPATIENT_CLINIC_OR_DEPARTMENT_OTHER)
Admission: RE | Admit: 2024-08-03 | Discharge: 2024-08-03 | Disposition: A | Discharge status: Home / Self Care | Source: Ambulatory Visit | Attending: Internal Medicine | Admitting: Internal Medicine

## 2024-08-03 DIAGNOSIS — R042 Hemoptysis: Secondary | ICD-10-CM | POA: Insufficient documentation

## 2024-08-03 DIAGNOSIS — M069 Rheumatoid arthritis, unspecified: Secondary | ICD-10-CM | POA: Diagnosis present

## 2024-08-03 DIAGNOSIS — J4540 Moderate persistent asthma, uncomplicated: Secondary | ICD-10-CM | POA: Diagnosis present

## 2024-08-09 ENCOUNTER — Ambulatory Visit (INDEPENDENT_AMBULATORY_CARE_PROVIDER_SITE_OTHER): Admitting: Pulmonary Disease

## 2024-08-09 ENCOUNTER — Encounter (HOSPITAL_BASED_OUTPATIENT_CLINIC_OR_DEPARTMENT_OTHER): Payer: Self-pay | Admitting: Pulmonary Disease

## 2024-08-09 VITALS — BP 123/63 | HR 70 | Ht 65.0 in | Wt 192.8 lb

## 2024-08-09 DIAGNOSIS — J4489 Other specified chronic obstructive pulmonary disease: Secondary | ICD-10-CM

## 2024-08-09 DIAGNOSIS — R918 Other nonspecific abnormal finding of lung field: Secondary | ICD-10-CM | POA: Diagnosis not present

## 2024-08-09 DIAGNOSIS — J302 Other seasonal allergic rhinitis: Secondary | ICD-10-CM | POA: Diagnosis not present

## 2024-08-09 DIAGNOSIS — R042 Hemoptysis: Secondary | ICD-10-CM

## 2024-08-09 DIAGNOSIS — J45909 Unspecified asthma, uncomplicated: Secondary | ICD-10-CM

## 2024-08-09 NOTE — Patient Instructions (Signed)
  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: fair control --CONTINUE budesonide  0.25 mg in the morning  --Encourage flutter valve use twice a day 10-20 times  History of asthmatic bronchitis: --Tried Breo - ineffective --Tried Breztri  - improved cough but persistent productive cough --CONTINUE Trelegy 200 mcg ONE puff ONCE a day.  --CONTINUE budesonide  one nebulizer a day  Hemoptysis: resolved --Reviewed CT scan with no evidence DAH or suggestion of bleed  Multiple subcentimeter pulmonary nodules: --Plan for CT Chest without contrast in 1 year

## 2024-08-09 NOTE — Progress Notes (Signed)
 Synopsis: Referred in February 2024 for chronic cough Has a history of rheumatoid arthritis on methotrexate, ; previously took Breo, Advair  Subjective:   PATIENT ID: [name] Samuel GENDER: female DOB: 1956-09-17, MRN: [id number]   HPI  Chief Complaint  Patient presents with   Follow-up    Chronic bronchitis   03/10/23 68 year old female with history of rheumatoid arthritis on methotrexate who presents as a new patient visit. Previously seen by Dr. McQuaid. Prior clinic note reviewed. She has had chronic productive cough x 6 months that improved with steroids and antibiotics in December but persisted off medications. She reports since starting the Breo, the coughing has improved by at least 50-60%. Has deep congestion that she can produce cough.  04/07/23 Since our last visit increased Breo to 200 with some improvement but cough remains persistent and productive. Interferes with her daily life and social interactions. Difficult to carry conversations and even watching a show. Wheezing improved but will occur with exertion. Has been using flutter valve 20x once a day to assist with mucous clearance.  07/20/23 Since our last visit she has been doing well on Breztri  with significant improvement in cough however she continues to have productive cough especially when laying down. Takes mucinex 1200 mg twice a day. Not needing flutter valve since mucous spontaneously comes up.  12/09/23 Since our last visit she was changed to Trelegy and felt it has not been effective. Compliant with inhaler. She has had increased mucous productive leading to coughing fits. Will produce about 3 teaspoons but still has chest congestion. Has not used flutter valve recently.  01/20/24 Since our last visit she reports her mucous production has increased and has been using flutter valve. Her production is estimated to be 1/2 a dixie cup daily. Breathing does improve once the mucous comes up but frustrated this is  continuous and feels people are judging her cough. Has not yet started budesonide   08/09/24 She was recently seen at [facility] for episode of hemoptysis on 08/01/24 with Dr. Meade. No further episodes of hemoptysis. She has hx of epistaxis that require cauterization earlier this month. Her primary concern at this time is the persistent mixed coughing with chest congestion. For her asthma she is compliant with Trelegy and not been using her nebulizer. Has not been using flutter valve.  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)   Memory deficit 12/27/2013   Memory disorder    Memory loss    Migraine without aura, without mention of intractable migraine without mention of status migrainosus    Myalgia and myositis, unspecified    Myalgia and myositis, unspecified    Other malaise and fatigue    PTSD (post-traumatic stress disorder)    Rheumatoid arthritis(714.0)      Family History  Problem Relation Age of Onset   Heart failure Father    Hypertension Mother      Social History   Socioeconomic History   Marital status: Married    Spouse name: Debby    Number of children: 3   Years of education: college   Highest education level: Not on file  Occupational History   Not on file  Tobacco Use   Smoking status: Never   Smokeless tobacco: Never  Substance and Sexual Activity  Alcohol use: No    Alcohol/week: 0.0 standard drinks of alcohol   Drug use: No   Sexual activity: Not on file  Other Topics Concern   Not on file  Social History Narrative   Patient is married and lives at home with her husband Building services engineer).Thomas.   Patient does not work.    Education college.   Right handed.   Caffeine two cups of coffee daily.   Social Drivers of Health   Financial Resource Strain: Low Risk  (11/30/2021)   Received from  [facility] visits prior to 01/23/2023.   Overall Financial Resource Strain (CARDIA)    Difficulty of Paying Living Expenses: Not hard at all  Food Insecurity: No Food Insecurity (06/23/2022)   Received from [facility]   Hunger Vital Sign  Transportation Needs: No Transportation Needs (06/23/2022)   Received from [facility] - Transportation  Physical Activity: Insufficiently Active (11/30/2021)   Received from [facility] visits prior to 01/23/2023.   Exercise Vital Sign    On average, how many days per week do you engage in moderate to strenuous exercise (like a brisk walk)?: 4 days    On average, how many minutes do you engage in exercise at this level?: 20 min  Stress: No Stress Concern Present (11/30/2021)   Received from [facility] visits prior to 01/23/2023.   [employer] of Occupational [facility] - Occupational Stress Questionnaire    Feeling of Stress : Only a little  Social Connections: Socially Integrated (11/30/2021)   Received from [facility] visits prior to 01/23/2023.   Social Connection and Isolation Panel    In a typical week, how many times do you talk on the phone with family, friends, or neighbors?: Three times a week    How often do you get together with friends or relatives?: Once a week    How often do you attend church or religious services?: More than 4 times per year    Do you belong to any clubs or organizations such as church groups, unions, fraternal or athletic groups, or school groups?: Yes    How often do you attend meetings of the clubs or organizations you belong to?: More than 4 times per year    Are you married, widowed, divorced, separated, never married, or living with a partner?: Married  Intimate Partner Violence: Not At Risk (11/30/2021)   Received from [facility] visits prior to 01/23/2023.   Humiliation, Afraid, Rape, and Kick  questionnaire    Within the last year, have you been afraid of your partner or ex-partner?: No    Within the last year, have you been humiliated or emotionally abused in other ways by your partner or ex-partner?: No    Within the last year, have you been kicked, hit, slapped, or otherwise physically hurt by your partner or ex-partner?: No    Within the last year, have you been raped or forced to have any kind of sexual activity by your partner or ex-partner?: No     Allergies  Allergen Reactions   Asa [Aspirin] Shortness Of Breath and Swelling    Wheezing and throat swells   Ibuprofen Other (See Comments) and Swelling    Wheezing and throat swells Wheezing and throat swells   Fluticasone  Other (See Comments)    makes my nose bleed     Outpatient Medications Prior to Visit  Medication Sig  Dispense Refill   b complex vitamins capsule Take 1 capsule by mouth daily.     budesonide  (PULMICORT ) 0.25 MG/2ML nebulizer solution Take 0.25 mg by nebulization 2 (two) times daily.     Cholecalciferol (VITAMIN D3) 5000 units CAPS Take 2,000 Units by mouth.     DULoxetine (CYMBALTA) 30 MG capsule Take 30 mg by mouth 2 (two) times daily.     DULoxetine (CYMBALTA) 60 MG capsule Take 60 mg by mouth 2 (two) times daily.     fenofibrate 54 MG tablet Take 27 mg by mouth daily.     fexofenadine (ALLEGRA) 180 MG tablet Take 180 mg by mouth daily.     Fluticasone -Umeclidin-Vilant (TRELEGY ELLIPTA ) 200-62.5-25 MCG/ACT AEPB Inhale 1 puff into the lungs daily. 60 each 11   folic acid (FOLVITE) 1 MG tablet Take 1 mg by mouth daily.      golimumab (SIMPONI ARIA) 50 MG/4ML SOLN injection Inject 50 mg into the vein now. Takes every other month     hydrochlorothiazide (HYDRODIURIL) 25 MG tablet Take 25 mg by mouth daily.      losartan (COZAAR) 25 MG tablet Take 25 mg by mouth daily.     methotrexate (RHEUMATREX) 2.5 MG tablet Take 2.5 mg by mouth once a week. Caution:Chemotherapy. Protect from light. 6 tablets  weekly     mirtazapine (REMERON) 15 MG tablet Take 15 mg by mouth at bedtime.     montelukast (SINGULAIR) 10 MG tablet Take 10 mg by mouth daily.     Multiple Vitamin (MULTIVITAMIN) capsule Take 1 capsule by mouth daily.     potassium chloride (K-DUR,KLOR-CON) 10 MEQ tablet Take 10 mEq by mouth.     pregabalin  (LYRICA ) 100 MG capsule Take 1 capsule (100 mg total) by mouth 3 (three) times daily. 270 capsule 0   Probiotic Product (PROBIOTIC DAILY) CAPS Take by mouth.     rizatriptan  (MAXALT -MLT) 10 MG disintegrating tablet Take 1 tablet by mouth at the onset of migraine, may repeat in 2 hours. max of 2 doses per day 18 tablet 0   rosuvastatin (CRESTOR) 20 MG tablet Take 20 mg by mouth at bedtime.     sertraline (ZOLOFT) 100 MG tablet Take 200 mg by mouth daily.      topiramate  (TOPAMAX ) 100 MG tablet Take 100 mg by mouth 2 (two) times daily.     busPIRone (BUSPAR) 30 MG tablet Take 30 mg by mouth 2 (two) times daily.  (Patient not taking: Reported on 08/09/2024)     No facility-administered medications prior to visit.    Review of Systems  Constitutional:  Negative for chills, diaphoresis, fever, malaise/fatigue and weight loss.  HENT:  Negative for congestion.   Respiratory:  Positive for cough and sputum production. Negative for hemoptysis, shortness of breath and wheezing.   Cardiovascular:  Negative for chest pain, palpitations and leg swelling.      Objective:  Physical Exam   Vitals:   08/09/24 0818  BP: 123/63  Pulse: 70  SpO2: 96%  Weight: 192 lb 12.8 oz (87.5 kg)  Height: 5' 5 (1.651 m)    Physical Exam: General: Well-appearing, no acute distress HENT: [place], AT Eyes: EOMI, no scleral icterus Respiratory: Clear to auscultation bilaterally.  No crackles, wheezing or rales Cardiovascular: RRR, -M/R/G, no JVD Extremities:-Edema,-tenderness Neuro: AAO x4, CNII-XII grossly intact Psych: Normal mood, normal affect   Chest imaging: July 2023 CT abdomen lung windows  independently reviewed showing normal pulmonary parenchyma, no fibrotic or other change, only bases  of the lungs visible November 2023 two-view chest x-ray interpreted as normal without pulmonary parenchymal abnormality, neurostimulator noticed with leads in thoracic spine  CT Chest 03/19/23 - No evidence of ILD. Fine centrilobular and ground glass nodularity in bases.  CT Chest 01/06/24 - Resolving GGO in lung bases  CT Chest 08/03/24 - Mild mosaic attenuation, scattered pulmonary nodules with largest 4 mm  PFT: 12/17/23 FVC 3.46 (105%) FEV1 2.91 (115%) Ratio 84  TLC 110% DLCO 119% Interpretation: No obstructive or restrictive defect. Borderline elevated gas exchange suggestive of asthma. Clinically correlate   Labs: CBC No results found for: WBC, RBC, HGB, HCT, PLT, MCV, MCH, MCHC, RDW, LYMPHSABS, MONOABS, EOSABS, BASOSABS  AFB 01/21/23 - neg    Assessment & Plan:   No diagnosis found.  Discussion: 68 year old female with RA on methotrexate who presents for follow-up asthmatic bronchitis. On triple therapy but has breakthrough cough. Currently not budesonide  and flutter valve which previously helped. Hemoptysis previously worsened cough but this has now resolved.  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: fair control --CONTINUE budesonide  0.25 mg in the morning  --Encourage flutter valve use twice a day 10-20 times  History of asthmatic bronchitis: --Tried Breo - ineffective --Tried Breztri  - improved cough but persistent productive cough --CONTINUE Trelegy 200 mcg ONE puff ONCE a day.  --CONTINUE budesonide  one nebulizer a day --Declined gabapentin. May consider in future  Hemoptysis: resolved --Reviewed CT scan with no evidence DAH or suggestion of bleed --If recurs, low threshold for bronchoscopy to rule out lung etiology  Multiple subcentimeter pulmonary nodules: --ORDERED for CT Chest without contrast in 1 year  Allergic  rhinitis: --Use NeilMed rinses over-the-counter twice daily to each nostril --Continue taking Allegra --Does not want to take Flonase or any nasal steroid due to history of epistaxis in the past  I have spent a total time of 30-minutes on the day of the appointment including chart review, data review, collecting history, coordinating care and discussing medical diagnosis and plan with the patient/family. Past medical history, allergies, medications were reviewed. Pertinent imaging, labs and tests included in this note have been reviewed and interpreted independently by me.  Slater Staff, MD [facility] 08/09/2024 8:26 AM     Immunizations: Immunization History  Administered Date(s) Administered   Hepatitis B, PED/ADOLESCENT 03/26/2006, 04/26/2006, 10/28/2006   Influenza Split 10/23/2015   Influenza, Quadrivalent, Recombinant, Inj, Pf 09/09/2017   Influenza,inj,Quad PF,6+ Mos 09/25/2016, 08/31/2020, 09/18/2021, 10/02/2022   Influenza-Unspecified 08/16/2018, 08/01/2019, 07/26/2023   PFIZER Comirnaty(Gray Top)Covid-19 Tri-Sucrose Vaccine 03/25/2020, 01/14/2021   PFIZER(Purple Top)SARS-COV-2 Vaccination 03/01/2020, 03/25/2020   Pneumococcal Polysaccharide-23 08/19/2006   Td (Adult),5 Lf Tetanus Toxid, Preservative Free 03/26/2006   Tdap 05/11/2016   Zoster Recombinant(Shingrix) 11/27/2019, 01/29/2020     Current Outpatient Medications:    b complex vitamins capsule, Take 1 capsule by mouth daily., Disp: , Rfl:    budesonide  (PULMICORT ) 0.25 MG/2ML nebulizer solution, Take 0.25 mg by nebulization 2 (two) times daily., Disp: , Rfl:    Cholecalciferol (VITAMIN D3) 5000 units CAPS, Take 2,000 Units by mouth., Disp: , Rfl:    DULoxetine (CYMBALTA) 30 MG capsule, Take 30 mg by mouth 2 (two) times daily., Disp: , Rfl:    DULoxetine (CYMBALTA) 60 MG capsule, Take 60 mg by mouth 2 (two) times daily., Disp: , Rfl:    fenofibrate 54 MG tablet, Take 27 mg by mouth  daily., Disp: , Rfl:    fexofenadine (ALLEGRA) 180 MG tablet, Take 180 mg by mouth  daily., Disp: , Rfl:    Fluticasone -Umeclidin-Vilant (TRELEGY ELLIPTA ) 200-62.5-25 MCG/ACT AEPB, Inhale 1 puff into the lungs daily., Disp: 60 each, Rfl: 11   folic acid (FOLVITE) 1 MG tablet, Take 1 mg by mouth daily. , Disp: , Rfl:    golimumab (SIMPONI ARIA) 50 MG/4ML SOLN injection, Inject 50 mg into the vein now. Takes every other month, Disp: , Rfl:    hydrochlorothiazide (HYDRODIURIL) 25 MG tablet, Take 25 mg by mouth daily. , Disp: , Rfl:    losartan (COZAAR) 25 MG tablet, Take 25 mg by mouth daily., Disp: , Rfl:    methotrexate (RHEUMATREX) 2.5 MG tablet, Take 2.5 mg by mouth once a week. Caution:Chemotherapy. Protect from light. 6 tablets weekly, Disp: , Rfl:    mirtazapine (REMERON) 15 MG tablet, Take 15 mg by mouth at bedtime., Disp: , Rfl:    montelukast (SINGULAIR) 10 MG tablet, Take 10 mg by mouth daily., Disp: , Rfl:    Multiple Vitamin (MULTIVITAMIN) capsule, Take 1 capsule by mouth daily., Disp: , Rfl:    potassium chloride (K-DUR,KLOR-CON) 10 MEQ tablet, Take 10 mEq by mouth., Disp: , Rfl:    pregabalin  (LYRICA ) 100 MG capsule, Take 1 capsule (100 mg total) by mouth 3 (three) times daily., Disp: 270 capsule, Rfl: 0   Probiotic Product (PROBIOTIC DAILY) CAPS, Take by mouth., Disp: , Rfl:    rizatriptan  (MAXALT -MLT) 10 MG disintegrating tablet, Take 1 tablet by mouth at the onset of migraine, may repeat in 2 hours. max of 2 doses per day, Disp: 18 tablet, Rfl: 0   rosuvastatin (CRESTOR) 20 MG tablet, Take 20 mg by mouth at bedtime., Disp: , Rfl:    sertraline (ZOLOFT) 100 MG tablet, Take 200 mg by mouth daily. , Disp: , Rfl:    topiramate  (TOPAMAX ) 100 MG tablet, Take 100 mg by mouth 2 (two) times daily., Disp: , Rfl:    busPIRone (BUSPAR) 30 MG tablet, Take 30 mg by mouth 2 (two) times daily.  (Patient not taking: Reported on 08/09/2024), Disp: , Rfl:

## 2024-08-21 ENCOUNTER — Ambulatory Visit (HOSPITAL_BASED_OUTPATIENT_CLINIC_OR_DEPARTMENT_OTHER): Admitting: Pulmonary Disease

## 2025-01-29 ENCOUNTER — Ambulatory Visit (HOSPITAL_BASED_OUTPATIENT_CLINIC_OR_DEPARTMENT_OTHER): Admitting: Pulmonary Disease
